# Patient Record
Sex: Female | Born: 1945 | Race: White | Hispanic: No | Smoking: Former smoker
Health system: Southern US, Community
[De-identification: ages and names within clinical notes are randomized; demographics above are authoritative.]

## PROBLEM LIST (undated history)

## (undated) DIAGNOSIS — K219 Gastro-esophageal reflux disease without esophagitis: Secondary | ICD-10-CM

## (undated) DIAGNOSIS — I447 Left bundle-branch block, unspecified: Secondary | ICD-10-CM

## (undated) DIAGNOSIS — R102 Pelvic and perineal pain: Secondary | ICD-10-CM

## (undated) DIAGNOSIS — G43909 Migraine, unspecified, not intractable, without status migrainosus: Secondary | ICD-10-CM

## (undated) DIAGNOSIS — J449 Chronic obstructive pulmonary disease, unspecified: Secondary | ICD-10-CM

## (undated) DIAGNOSIS — E785 Hyperlipidemia, unspecified: Secondary | ICD-10-CM

## (undated) DIAGNOSIS — R42 Dizziness and giddiness: Secondary | ICD-10-CM

## (undated) DIAGNOSIS — M1990 Unspecified osteoarthritis, unspecified site: Secondary | ICD-10-CM

## (undated) DIAGNOSIS — H269 Unspecified cataract: Secondary | ICD-10-CM

## (undated) DIAGNOSIS — N393 Stress incontinence (female) (male): Secondary | ICD-10-CM

## (undated) DIAGNOSIS — M2550 Pain in unspecified joint: Secondary | ICD-10-CM

## (undated) DIAGNOSIS — I2510 Atherosclerotic heart disease of native coronary artery without angina pectoris: Secondary | ICD-10-CM

## (undated) HISTORY — DX: Dizziness and giddiness: R42

## (undated) HISTORY — DX: Stress incontinence (female) (male): N39.3

## (undated) HISTORY — DX: Left bundle-branch block, unspecified: I44.7

## (undated) HISTORY — DX: Gastro-esophageal reflux disease without esophagitis: K21.9

## (undated) HISTORY — DX: Unspecified cataract: H26.9

## (undated) HISTORY — PX: EYE SURGERY: SHX253

## (undated) HISTORY — DX: Pain in unspecified joint: M25.50

## (undated) HISTORY — PX: TOE SURGERY: SHX1073

## (undated) HISTORY — PX: AUGMENTATION MAMMAPLASTY: SUR837

## (undated) HISTORY — DX: Pelvic and perineal pain: R10.2

## (undated) HISTORY — DX: Migraine, unspecified, not intractable, without status migrainosus: G43.909

---

## 1992-04-01 HISTORY — PX: CHOLECYSTECTOMY: SHX55

## 1997-04-01 HISTORY — PX: OTHER SURGICAL HISTORY: SHX169

## 2004-05-15 ENCOUNTER — Ambulatory Visit: Payer: Self-pay | Admitting: Internal Medicine

## 2005-07-15 ENCOUNTER — Ambulatory Visit: Payer: Self-pay | Admitting: Internal Medicine

## 2005-07-22 ENCOUNTER — Ambulatory Visit: Payer: Self-pay | Admitting: General Practice

## 2005-08-05 ENCOUNTER — Ambulatory Visit: Payer: Self-pay | Admitting: General Practice

## 2005-08-27 ENCOUNTER — Ambulatory Visit: Payer: Self-pay | Admitting: Gastroenterology

## 2006-07-17 ENCOUNTER — Ambulatory Visit: Payer: Self-pay | Admitting: Internal Medicine

## 2007-07-21 ENCOUNTER — Ambulatory Visit: Payer: Self-pay | Admitting: Internal Medicine

## 2008-01-05 ENCOUNTER — Ambulatory Visit: Payer: Self-pay | Admitting: Internal Medicine

## 2008-02-01 ENCOUNTER — Encounter: Admission: RE | Admit: 2008-02-01 | Discharge: 2008-02-01 | Payer: Self-pay | Admitting: Neurological Surgery

## 2008-02-09 ENCOUNTER — Encounter: Admission: RE | Admit: 2008-02-09 | Discharge: 2008-02-09 | Payer: Self-pay | Admitting: Neurological Surgery

## 2008-04-01 HISTORY — PX: SHOULDER SURGERY: SHX246

## 2008-04-01 HISTORY — PX: NECK SURGERY: SHX720

## 2008-04-01 HISTORY — PX: JOINT REPLACEMENT: SHX530

## 2008-04-15 ENCOUNTER — Ambulatory Visit (HOSPITAL_COMMUNITY): Admission: RE | Admit: 2008-04-15 | Discharge: 2008-04-16 | Payer: Self-pay | Admitting: Neurological Surgery

## 2008-05-16 ENCOUNTER — Encounter: Admission: RE | Admit: 2008-05-16 | Discharge: 2008-05-16 | Payer: Self-pay | Admitting: Neurological Surgery

## 2008-06-14 ENCOUNTER — Encounter: Admission: RE | Admit: 2008-06-14 | Discharge: 2008-06-14 | Payer: Self-pay | Admitting: Neurological Surgery

## 2008-07-25 ENCOUNTER — Ambulatory Visit: Payer: Self-pay | Admitting: Internal Medicine

## 2008-07-26 ENCOUNTER — Encounter: Admission: RE | Admit: 2008-07-26 | Discharge: 2008-07-26 | Payer: Self-pay | Admitting: Neurological Surgery

## 2008-07-28 ENCOUNTER — Ambulatory Visit: Payer: Self-pay | Admitting: Internal Medicine

## 2008-10-24 ENCOUNTER — Encounter: Admission: RE | Admit: 2008-10-24 | Discharge: 2008-10-24 | Payer: Self-pay | Admitting: Neurological Surgery

## 2009-08-15 ENCOUNTER — Ambulatory Visit: Payer: Self-pay | Admitting: Internal Medicine

## 2010-04-22 ENCOUNTER — Encounter: Payer: Self-pay | Admitting: Neurological Surgery

## 2010-07-16 LAB — DIFFERENTIAL
Lymphocytes Relative: 34 % (ref 12–46)
Lymphs Abs: 2.7 10*3/uL (ref 0.7–4.0)
Neutro Abs: 4.3 10*3/uL (ref 1.7–7.7)
Neutrophils Relative %: 54 % (ref 43–77)

## 2010-07-16 LAB — BASIC METABOLIC PANEL
BUN: 11 mg/dL (ref 6–23)
Creatinine, Ser: 0.61 mg/dL (ref 0.4–1.2)
GFR calc Af Amer: >60 mL/min (ref >60)
GFR calc non Af Amer: >60 mL/min (ref >60)

## 2010-07-16 LAB — CBC
Platelets: 298 10*3/uL (ref 150–400)
WBC: 7.9 10*3/uL (ref 4.0–10.5)

## 2010-07-16 LAB — PROTIME-INR
INR: 1 (ref 0.00–1.49)
Prothrombin Time: 12.8 seconds (ref 11.6–15.2)

## 2010-07-16 LAB — APTT: aPTT: 29 seconds (ref 24–37)

## 2010-08-14 NOTE — Op Note (Signed)
NAME:  Danielle Richardson, MARIE               ACCOUNT NO.:  [id number]   MEDICAL RECORD NO.:  [id number]          PATIENT TYPE:  OIB   LOCATION:  3536                         FACILITY:  [facility]   PHYSICIAN:  Tia Alert, MD     DATE OF BIRTH:  Dec 15, 1945   DATE OF PROCEDURE:  04/15/2008  DATE OF DISCHARGE:                               OPERATIVE REPORT   PREOPERATIVE DIAGNOSIS:  Cervical spondylosis with cervical disk  herniation C3-4, C4-5 with neck and shoulder pain.   POSTOPERATIVE DIAGNOSIS:  Cervical spondylosis with cervical disk  herniation C3-4, C4-5 with neck and shoulder pain.   PROCEDURES:  1. Decompressive anterior cervical diskectomy C3-4, C4-5.  2. Anterior cervical arthrodesis C3-4, C4-5 utilizing a 7-mm      corticocancellous allografts.  3. Anterior cervical plating C3-C5 inclusive utilizing the Biomet      anterior cervical plate.   SURGEON:  Tia Alert, MD   ASSISTANT:  Kathaleen Maser. Pool, MD   ANESTHESIA:  General endotracheal.   COMPLICATIONS:  None apparent.   INDICATIONS FOR PROCEDURE:  [name] is a 65 year old female who  presented with severe neck pain with headaches and shoulder pain.  She  had an MRI and then a CT myelogram, which showed midline disk  herniations at C3-4 and C4-5.  The C4-5 disk herniation being much  larger causing some cord compression.  I recommended a two-level  anterior cervical diskectomy and fusion with plating.  She understood  the risks, benefits, expected outcome, and wished to proceed.   DESCRIPTION OF PROCEDURE:  The patient was taken to the operating room  and after induction of adequate generalized endotracheal anesthesia, she  was placed in a supine position on the operating room table.  Her right  anterior cervical region was prepped with DuraPrep and then draped in  the usual sterile fashion.  Local anesthesia 5 mL was injected and a  transverse incision was made to the right of midline and carried down to  the  platysma, which was elevated, opened and undermined with Metzenbaum  scissors.  I then dissected in a plane medial to the sternocleidomastoid  muscle and internal carotid artery and lateral to the trachea and  esophagus to expose C3-4 and C4-5.  Intraoperative fluoroscopy confirmed  my level and then the longus colli muscles were taken down and the  Shadow-Line retractors were placed under this to expose C3-4 and C4-5.  Intraoperative fluoroscopy once again confirmed my level and then the  disk spaces were incised and an initial diskectomy was done with  pituitary rongeurs and curved curettes.  I then used the high-speed  drill to drill the endplates for later arthrodesis.  I drilled in a  rectangular fashion drilling down to the level of the posterior  osteophytes and posterior longitudinal ligament.  I started at C3-4, I  opened the posterior longitudinal ligament utilizing microscopic  dissection with a nerve hook and then undercut the vertebral bodies with  1 and 2 mm Kerrison punch until the dura was full all the way across.  I  then palpated in a circumferential  fashion with a nerve hook to assure  adequate decompression by both visualization and palpation and it  appeared to be well decompressed.  We measured the interspace to be 7 mm  and I used 7-mm corticocancellous allograft into the interspace at C3-4.  We then performed the exact same decompression at C4-5, opening the  posterior longitudinal ligament with a nerve hook and then using the 1  and 2 mm Kerrison punch to undercut the vertebral bodies at C4 and C5.  There was a large disk herniation in the midline, it was removed with a  nerve hook from the midline, superior to the interspace at C4-5 behind  the C4 vertebral body.  The vertebral bodies were undercut until the  dura was relaxed all the way across, we palpated with a nerve hook in a  circumferential fashion to assure adequate decompression of the nerve  root and of  the central canal.  We then irrigated with saline solution  and measured this interspace to be 7 mm, I used a 7-mm corticocancellous  allograft and tapped this into position at C4-5.  We then used a Biomet  anterior cervical plate, placed two 13 mm variable angle screws in the  bodies of C3, C4, and C5.  These locked the plate by locking mechanism.  I then irrigated with saline solution containing bacitracin, dried all  bleeding points with surgery foam and with bipolar cautery, irrigated  this away and once meticulous hemostasis was achieved, closed the  platysma with 3-0 Vicryl, closed the subcuticular tissue with 0 Vicryl,  and closed the skin with benzoin and Steri-Strips.  The drapes were  removed.  A sterile dressing was applied.  The patient was awakened from  general anesthesia and transferred to the recovery room in stable  condition.  At the end of the procedure, all sponge, needle, and  instrument counts were correct.      Tia Alert, MD  Electronically Signed     DSJ/MEDQ  D:  04/15/2008  T:  04/16/2008  Job:  [id number]

## 2010-08-20 ENCOUNTER — Ambulatory Visit: Payer: Self-pay | Admitting: Internal Medicine

## 2011-09-11 ENCOUNTER — Ambulatory Visit: Payer: Self-pay | Admitting: Internal Medicine

## 2012-09-28 ENCOUNTER — Ambulatory Visit: Payer: Self-pay | Admitting: Internal Medicine

## 2013-05-25 ENCOUNTER — Ambulatory Visit: Payer: Self-pay | Admitting: Gastroenterology

## 2013-05-28 LAB — PATHOLOGY REPORT

## 2013-09-11 DIAGNOSIS — K219 Gastro-esophageal reflux disease without esophagitis: Secondary | ICD-10-CM | POA: Insufficient documentation

## 2013-09-11 DIAGNOSIS — E785 Hyperlipidemia, unspecified: Secondary | ICD-10-CM | POA: Insufficient documentation

## 2013-09-11 DIAGNOSIS — R002 Palpitations: Secondary | ICD-10-CM | POA: Insufficient documentation

## 2013-09-11 DIAGNOSIS — M810 Age-related osteoporosis without current pathological fracture: Secondary | ICD-10-CM | POA: Insufficient documentation

## 2013-09-11 DIAGNOSIS — J449 Chronic obstructive pulmonary disease, unspecified: Secondary | ICD-10-CM

## 2013-09-11 DIAGNOSIS — Z8669 Personal history of other diseases of the nervous system and sense organs: Secondary | ICD-10-CM | POA: Insufficient documentation

## 2013-09-11 DIAGNOSIS — I10 Essential (primary) hypertension: Secondary | ICD-10-CM | POA: Insufficient documentation

## 2013-09-11 DIAGNOSIS — J309 Allergic rhinitis, unspecified: Secondary | ICD-10-CM | POA: Insufficient documentation

## 2013-09-11 HISTORY — DX: Hyperlipidemia, unspecified: E78.5

## 2013-09-11 HISTORY — DX: Chronic obstructive pulmonary disease, unspecified: J44.9

## 2013-09-29 ENCOUNTER — Ambulatory Visit: Payer: Self-pay | Admitting: Internal Medicine

## 2013-10-19 DIAGNOSIS — G43019 Migraine without aura, intractable, without status migrainosus: Secondary | ICD-10-CM | POA: Insufficient documentation

## 2014-03-30 DIAGNOSIS — M65811 Other synovitis and tenosynovitis, right shoulder: Secondary | ICD-10-CM | POA: Insufficient documentation

## 2014-03-30 DIAGNOSIS — Z9660 Presence of unspecified orthopedic joint implant: Secondary | ICD-10-CM | POA: Insufficient documentation

## 2014-03-30 DIAGNOSIS — J0110 Acute frontal sinusitis, unspecified: Secondary | ICD-10-CM | POA: Insufficient documentation

## 2014-10-21 ENCOUNTER — Other Ambulatory Visit: Payer: Self-pay | Admitting: Internal Medicine

## 2014-10-21 DIAGNOSIS — Z1231 Encounter for screening mammogram for malignant neoplasm of breast: Secondary | ICD-10-CM

## 2014-10-26 ENCOUNTER — Ambulatory Visit
Admission: RE | Admit: 2014-10-26 | Discharge: 2014-10-26 | Disposition: A | Discharge status: Home / Self Care | Payer: Medicare Other | Source: Ambulatory Visit | Attending: Internal Medicine | Admitting: Internal Medicine

## 2014-10-26 ENCOUNTER — Other Ambulatory Visit: Payer: Self-pay | Admitting: Internal Medicine

## 2014-10-26 DIAGNOSIS — Z1231 Encounter for screening mammogram for malignant neoplasm of breast: Secondary | ICD-10-CM | POA: Insufficient documentation

## 2015-04-25 ENCOUNTER — Ambulatory Visit (INDEPENDENT_AMBULATORY_CARE_PROVIDER_SITE_OTHER): Payer: Medicare Other | Admitting: Obstetrics and Gynecology

## 2015-04-25 ENCOUNTER — Encounter: Payer: Self-pay | Admitting: Obstetrics and Gynecology

## 2015-04-25 VITALS — BP 156/75 | HR 73 | Ht 65.0 in | Wt 178.9 lb

## 2015-04-25 DIAGNOSIS — R102 Pelvic and perineal pain: Secondary | ICD-10-CM

## 2015-04-25 DIAGNOSIS — R0789 Other chest pain: Secondary | ICD-10-CM | POA: Insufficient documentation

## 2015-04-25 DIAGNOSIS — M1990 Unspecified osteoarthritis, unspecified site: Secondary | ICD-10-CM | POA: Insufficient documentation

## 2015-04-25 DIAGNOSIS — N941 Unspecified dyspareunia: Secondary | ICD-10-CM | POA: Diagnosis not present

## 2015-04-25 DIAGNOSIS — N3941 Urge incontinence: Secondary | ICD-10-CM

## 2015-04-25 DIAGNOSIS — N393 Stress incontinence (female) (male): Secondary | ICD-10-CM

## 2015-04-25 DIAGNOSIS — N952 Postmenopausal atrophic vaginitis: Secondary | ICD-10-CM | POA: Diagnosis not present

## 2015-04-25 DIAGNOSIS — N951 Menopausal and female climacteric states: Secondary | ICD-10-CM | POA: Insufficient documentation

## 2015-04-25 DIAGNOSIS — N3946 Mixed incontinence: Secondary | ICD-10-CM | POA: Insufficient documentation

## 2015-04-25 DIAGNOSIS — K2970 Gastritis, unspecified, without bleeding: Secondary | ICD-10-CM | POA: Insufficient documentation

## 2015-04-25 MED ORDER — OXYBUTYNIN CHLORIDE 5 MG PO TABS
5.0000 mg | ORAL_TABLET | Freq: Every day | ORAL | Status: DC
Start: 2015-04-25 — End: 2015-07-19

## 2015-04-25 MED ORDER — ESTROGENS, CONJUGATED 0.625 MG/GM VA CREA: 0.2500 | TOPICAL_CREAM | VAGINAL | Status: DC

## 2015-04-25 MED ORDER — ESTRADIOL 0.1 MG/GM VA CREA: 0.2500 | TOPICAL_CREAM | Freq: Every day | VAGINAL | Status: DC

## 2015-04-25 NOTE — Progress Notes (Signed)
GYN ENCOUNTER NOTE  Subjective:       Danielle Richardson is a 70 y.o. G58P2002 female is here for gynecologic evaluation of the following issues:  1.  Pelvic pain   2.  Urge incontinence. 3.  Stress incontinece    70 year old married white female, para 2002, menopausal since age 12, using no hormone replacement therapy, history of pubovaginal sling by Dr. Elisabeth Pigeon in 1999) mesh), presents for evaluation of above complaints.  PELVIC PAIN: Patient reports the pain to be described as something quotes sticking in me".   She feels pelvic sure, which tends to get worse over the day; symptoms improved when lying down.  The colon sitting does not help symptoms; voiding makes symptoms a little bit better   Recent urinalysis is negative for evidence of infection. Patient discontinued intercourse approximately one year ago because became too painful to continue.  She does not report any vaginal bleeding or discharge.  GYN history: Menarche age 64. Menopause age 46. History of HRT therapy for less than 1 year, discontinued due to chronic abnormal uterine bleeding. No current vasomotor symptoms. History of osteoporosis. No history of abnormal Pap smears. History of pubovaginal sling performed by Dr. Elisabeth Pigeon in 1999; initially helped stress incontinence.Marland Kitchen History of dyspareunia, severe enough to [facility] relations 2016 No vaginal bleeding or vaginal discharge   Gynecologic History No LMP recorded. Patient is postmenopausal.   GI history: Bowel movements eve other day. History of chronic constipation requiring MiraLAX use. No significant splinting is noted.  Obstetric History OB History  Gravida Para Term Preterm AB SAB TAB Ectopic Multiple Living  2 2 2       2     # Outcome Date GA Lbr Len/2nd Weight Sex Delivery Anes PTL Lv  2 Term 1972   7 lb 1.8 oz (3.225 kg) F Vag-Spont   Y  1 Term 1968   6 lb 11.2 oz (3.039 kg) M Vag-Spont   Y      Past Medical History  Diagnosis  Date  . Migraine   . Vertigo   . Joint pain   . Pelvic pain in female   . GERD (gastroesophageal reflux disease)   . SUI (stress urinary incontinence, female)     Past Surgical History  Procedure Laterality Date  . Augmentation mammaplasty Bilateral     1980  . Augmentation mammaplasty Bilateral     implants removed 1993  . Bladder tack  1999  . Cholecystectomy  1994  . Toe surgery    . Neck surgery    . Shoulder surgery      No current outpatient prescriptions on file prior to visit.   No current facility-administered medications on file prior to visit.    Allergies  Allergen Reactions  . Alendronate Nausea Only  . Codeine Other (See Comments)  . Penicillins Other (See Comments)  . Rofecoxib Other (See Comments)  . Oseltamivir Rash    Social History   Social History  . Marital Status: Married    Spouse Name: N/A  . Number of Children: N/A  . Years of Education: N/A   Occupational History  . Not on file.   Social History Main Topics  . Smoking status: Former Smoker    Quit date: 04/02/1999  . Smokeless tobacco: Not on file  . Alcohol Use: Yes     Comment: rare  . Drug Use: No  . Sexual Activity: No   Other Topics Concern  . Not on file  Social History Narrative    Family History  Problem Relation Age of Onset  . Diabetes Father   . Diabetes Brother   . Colon cancer Maternal Uncle   . Ovarian cancer Maternal Grandfather   . Breast cancer Neg Hx     The following portions of the patient's history were reviewed and updated as appropriate: allergies, current medications, past family history, past medical history, past social history, past surgical history and problem list.  Review of Systems Review of Systems - General ROS: negative for - chills, fatigue, fever, hot flashes, malaise or night sweats Hematological and Lymphatic ROS: negative for - bleeding problems or swollen lymph nodes Gastrointestinal ROS: negative for - abdominal pain, blood  in stools, change in bowel habits and nausea/vomiting Musculoskeletal ROS: negative for - joint pain, muscle pain or muscular weakness Genito-Urinary ROS: negative for - change in menstrual cycle, dysmenorrhea, dysuria, genital discharge, genital ulcers, hematuria, incontinence, irregular/heavy menses, nocturia. POSITIVE- dyspareunia, Pelvic pain  Objective:   BP 156/75 mmHg  Pulse 73  Ht 5\' 5"  (1.651 m)  Wt 178 lb 14.4 oz (81.149 kg)  BMI 29.77 kg/m2 CONSTITUTIONAL: Well-developed, well-nourished female in no acute distress.  HENT:  Normocephalic, atraumatic.  NECK: Normal range of motion, supple, no masses.  Normal thyroid.  SKIN: Skin is warm and dry. No rash noted. Not diaphoretic. No erythema. No pallor. [place]: Alert and oriented to person, place, and time. PSYCHIATRIC: Normal mood and affect. Normal behavior. Normal judgment and thought content. CARDIOVASCULAR:Not Examined RESPIRATORY: Not Examined BREASTS: Not Examined ABDOMEN: Soft, non distended; Non tender.  No Organomegaly. PELVIC:  External Genitalia: Atrophic  BUS: Normal  Vagina: Moderate atrophy; no significant prolapse; just proximal to the introitus in the suburethral area is  Scarring from previous sling which is tender 2/4 without evidence of erosion  Cervix: Normal; No cervical motion tenderness  Uterus: Normal size, shape,consistency, mobileComment nontender  Adnexa: Normal   RV: Normal External exam; normal Sphincter tone,; no rectal masses  Bladder: Nontender MUSCULOSKELETAL: Normal range of motion. No tenderness.  No cyanosis, clubbing, or edema.     Assessment:   1. Pelvic pain in female - US Pelvis Complete; Future - US Transvaginal Non-OB; Future  2. Vaginal atrophy  3. Stress incontinence, female  4. Urge incontinence     Plan:   1.  Pelvic ultrasound. 2.  Begin oxybutynin 5 mg nightly 3.  Begin Estrace cream 1/2 g Vaginally nightly 30 days; then  Twice weekly application 4.   Discussed physical therapy for mixed incontinence management. 5.  Return in 1 month for follow-up  Brayton Mars, MD  Note: This dictation was prepared with Dragon dictation along with smaller phrase technology. Any transcriptional errors that result from this process are unintentional.

## 2015-04-25 NOTE — Patient Instructions (Signed)
1.  Pelvic ultrasound is ordered. 2.  Begin Estrace cream 1/2 g intravaginally nightly for 4 weeks, then twice weekly. 3.  Again, oxybutynin 5 mg daily at bedtime for urge symptoms. 4.  Return in 4 weeks

## 2015-04-25 NOTE — Addendum Note (Signed)
Addended by: Elouise Munroe on: 04/25/2015 04:29 PM   Modules accepted: Orders

## 2015-04-26 ENCOUNTER — Telehealth: Payer: Self-pay | Admitting: Obstetrics and Gynecology

## 2015-04-26 MED ORDER — ESTROGENS, CONJUGATED 0.625 MG/GM VA CREA: 0.2500 | TOPICAL_CREAM | VAGINAL | Status: DC

## 2015-04-26 NOTE — Telephone Encounter (Signed)
Went to pick up estrace, it was $265.00. She can't afford that. Do we have samples and does he pescribe premarin instead. Use [facility] instead of [facility] for this one.

## 2015-04-26 NOTE — Telephone Encounter (Signed)
Pt aware premarin sent [facility].

## 2015-05-02 ENCOUNTER — Ambulatory Visit (INDEPENDENT_AMBULATORY_CARE_PROVIDER_SITE_OTHER): Payer: Medicare Other

## 2015-05-02 DIAGNOSIS — R102 Pelvic and perineal pain: Secondary | ICD-10-CM

## 2015-05-10 ENCOUNTER — Telehealth: Payer: Self-pay | Admitting: Obstetrics and Gynecology

## 2015-05-10 MED ORDER — ESTROGENS, CONJUGATED 0.625 MG/GM VA CREA: 0.2500 | TOPICAL_CREAM | Freq: Every day | VAGINAL | Status: DC

## 2015-05-10 NOTE — Telephone Encounter (Signed)
Pt aware rx erx to [facility].

## 2015-05-10 NOTE — Telephone Encounter (Signed)
Danielle Richardson is having problems with pharmacy getting premarin cream. She wants you to send a 30 day supply to [facility] until she can get this mess straightened up with [facility]

## 2015-05-23 ENCOUNTER — Encounter: Payer: Self-pay | Admitting: Obstetrics and Gynecology

## 2015-05-23 ENCOUNTER — Ambulatory Visit (INDEPENDENT_AMBULATORY_CARE_PROVIDER_SITE_OTHER): Payer: Medicare Other | Admitting: Obstetrics and Gynecology

## 2015-05-23 VITALS — BP 166/73 | HR 79 | Ht 65.5 in | Wt 180.6 lb

## 2015-05-23 DIAGNOSIS — N882 Stricture and stenosis of cervix uteri: Secondary | ICD-10-CM | POA: Diagnosis not present

## 2015-05-23 DIAGNOSIS — R938 Abnormal findings on diagnostic imaging of other specified body structures: Secondary | ICD-10-CM

## 2015-05-23 DIAGNOSIS — R9389 Abnormal findings on diagnostic imaging of other specified body structures: Secondary | ICD-10-CM | POA: Insufficient documentation

## 2015-05-23 DIAGNOSIS — N3946 Mixed incontinence: Secondary | ICD-10-CM | POA: Diagnosis not present

## 2015-05-23 DIAGNOSIS — D259 Leiomyoma of uterus, unspecified: Secondary | ICD-10-CM | POA: Insufficient documentation

## 2015-05-23 NOTE — Patient Instructions (Signed)
1. Endometrial biopsy is done today. 2. Tylenol or Advil as needed for pain is recommended 3. Return in 10 days for follow-up on an initial biopsy results 4. Continue taking oxybutynin 5 mg twice a day    ENDOMETRIAL BIOPSY POST-PROCEDURE INSTRUCTIONS  1. You may take Ibuprofen, Aleve or Tylenol for pain if needed.  Cramping should resolve within in 24 hours.  2. You may have a small amount of spotting.  You should wear a mini pad for the next few days.  3. You may have intercourse after 24 hours.  4. You need to call if you have any pelvic pain, fever, heavy bleeding or foul smelling vaginal discharge.  5. Shower or bathe as normal  6. We will call you within one week with results or we will discuss   the results at your follow-up appointment if needed.

## 2015-05-23 NOTE — Progress Notes (Signed)
Chief complaint: 1. Stable bladder 2. Pelvic pain 3. Thickened endometrium on ultrasound 4. Uterine fibroid  Patient presents for follow-up on pelvic ultrasound as well as medication which was started for unstable bladder.  UNSTABLE BLADDER: Patient is on oxybutynin 5 mg twice a day. She has had resolution of her urge incontinence. However, she still is getting up twice a night to void. She is pleased with the medication at this time and wishes to continue the current dosing regimen. She is not having any significant side effects such as dry mouth or dry eyes.  PELVIC PAIN: Recent ultrasound demonstrated the patient having a small uterine fibroid as well as a thickened endometrium measuring 11 mm. She is not experiencing any vaginal bleeding or spotting. The patient continues to have shooting stabbing pains within the pelvis. Endometrial biopsy is recommended because of the thickened endometrium in this patient not on HRT.  OBJECTIVE: BP 166/73 mmHg  Pulse 79  Ht 5' 5.5" (1.664 m)  Wt 180 lb 9.6 oz (81.92 kg)  BMI 29.59 kg/m2 Pleasant white female in no acute distress Abdomen: Soft, nontender, without organomegaly Pelvic exam: External genitalia-normal BUS-normal Vagina-normal Cervix-parous; stenotic with inability to pass a 3 mm Pipelle. No cervical motion tenderness Uterus-midplane, normal size and shape, mobile, nontender Adnexa-nonpalpable nontender RV-normal external exam  PROCEDURE: Endometrial biopsy The patient verbally consents to the procedure. Benefits and risks were discussed. Bimanual exam demonstrates the uterus to be midplane and normal size and shape. Speculum is placed in the vagina. 3 mm Pipelle instrument cannot be passed through the cervical os due to stenosis. Single-tooth tenaculum was applied to the anterior lip of the cervix. The cervix was dilated with the uterine sound; the sound measures to a depth of 8 cm. The Pipelle, 3 mm instrument was then passed  successfully to the uterine fundus with extraction of granular yellow pink tissue, moderate. Minimal bleeding is encountered. Instrument patient is removed from the vagina. Procedures well-tolerated. Specimens sent to pathology.  ASSESSMENT: 1. Pelvic pain, unclear etiology 2. Small uterine fibroid; thickened endometrium on ultrasound 3. Cervical stenosis 4. Unstable bladder, symptom improvement on oxybutynin 5 mg twice a day  PLAN: 1. Cervical canal dilation 2. Endometrial biopsy 3. Continue with oxybutynin 5 mg twice a day 4. Return in 10 days for follow-up endometrial biopsy results and further management planning  Brayton Mars, MD  Note: This dictation was prepared with Dragon dictation along with smaller phrase technology. Any transcriptional errors that result from this process are unintentional.

## 2015-05-25 LAB — PATHOLOGY

## 2015-06-01 ENCOUNTER — Encounter: Payer: Self-pay | Admitting: Obstetrics and Gynecology

## 2015-06-01 ENCOUNTER — Ambulatory Visit (INDEPENDENT_AMBULATORY_CARE_PROVIDER_SITE_OTHER): Payer: Medicare Other | Admitting: Obstetrics and Gynecology

## 2015-06-01 VITALS — BP 155/73 | HR 74 | Wt 179.2 lb

## 2015-06-01 DIAGNOSIS — R102 Pelvic and perineal pain: Secondary | ICD-10-CM | POA: Diagnosis not present

## 2015-06-01 DIAGNOSIS — N840 Polyp of corpus uteri: Secondary | ICD-10-CM

## 2015-06-01 DIAGNOSIS — R938 Abnormal findings on diagnostic imaging of other specified body structures: Secondary | ICD-10-CM

## 2015-06-01 DIAGNOSIS — R9389 Abnormal findings on diagnostic imaging of other specified body structures: Secondary | ICD-10-CM

## 2015-06-01 NOTE — Patient Instructions (Signed)
1. Hysteroscopy/D&C will be scheduled in approximately 4-6 weeks 2. Return for preop appointment the week before surgery

## 2015-06-01 NOTE — Progress Notes (Signed)
Chief complaint: 1. Thickened endometrium on ultrasound 2. Follow up on endometrial biopsy 3. Pelvic pain  Patient presents for follow-up on endometrial biopsy done for thickened endometrium identified on ultrasound.  Endometrial biopsy pathology: Benign endometrial polyp without evidence of carcinoma or hyperplasia  Patient is still experiencing atypical pelvic pain with shooting spasm-type symptoms.  Colonoscopy in 2015 was normal; bowel function is normal Patient does have urinary frequency but her symptoms with urination do not mimic the pelvic pain type symptoms.  Past medical history, past surgical history, problem list, medications, and allergies are reviewed  OBJECTIVE: Physical exam deferred  ASSESSMENT: 1. Thickened endometrium on ultrasound 2. Benign endometrial polyp on endometrial biopsy 3. Pelvic pain-stabbing shooting symptoms persist; unclear etiology  PLAN: 1. Observation versus surgical intervention with hysteroscopy/D&C was reviewed. 2. Patient desires to proceed with Hysteroscopy/D&C 3. To be scheduled within the next 4-6 weeks. Patient will return for preop appointment  A total of 15 minutes were spent face-to-face with the patient during this encounter and over half of that time dealt with counseling and coordination of care.  Brayton Mars, MD  Note: This dictation was prepared with Dragon dictation along with smaller phrase technology. Any transcriptional errors that result from this process are unintentional.

## 2015-07-19 ENCOUNTER — Other Ambulatory Visit: Payer: Self-pay

## 2015-07-19 MED ORDER — OXYBUTYNIN CHLORIDE 5 MG PO TABS: 5.0000 mg | ORAL_TABLET | Freq: Every day | ORAL | Status: DC

## 2015-08-17 ENCOUNTER — Encounter: Payer: Self-pay | Admitting: Obstetrics and Gynecology

## 2015-08-17 ENCOUNTER — Encounter
Admission: RE | Admit: 2015-08-17 | Discharge: 2015-08-17 | Disposition: A | Discharge status: Home / Self Care | Payer: Medicare Other | Source: Ambulatory Visit | Attending: Obstetrics and Gynecology | Admitting: Obstetrics and Gynecology

## 2015-08-17 ENCOUNTER — Ambulatory Visit (INDEPENDENT_AMBULATORY_CARE_PROVIDER_SITE_OTHER): Payer: Medicare Other | Admitting: Obstetrics and Gynecology

## 2015-08-17 VITALS — BP 148/82 | HR 82 | Ht 65.5 in | Wt 179.1 lb

## 2015-08-17 DIAGNOSIS — Z01810 Encounter for preprocedural cardiovascular examination: Secondary | ICD-10-CM | POA: Insufficient documentation

## 2015-08-17 DIAGNOSIS — R938 Abnormal findings on diagnostic imaging of other specified body structures: Secondary | ICD-10-CM

## 2015-08-17 DIAGNOSIS — Z01818 Encounter for other preprocedural examination: Secondary | ICD-10-CM

## 2015-08-17 DIAGNOSIS — R102 Pelvic and perineal pain: Secondary | ICD-10-CM

## 2015-08-17 DIAGNOSIS — N840 Polyp of corpus uteri: Secondary | ICD-10-CM

## 2015-08-17 DIAGNOSIS — Z01812 Encounter for preprocedural laboratory examination: Secondary | ICD-10-CM | POA: Insufficient documentation

## 2015-08-17 DIAGNOSIS — R9389 Abnormal findings on diagnostic imaging of other specified body structures: Secondary | ICD-10-CM

## 2015-08-17 HISTORY — DX: Unspecified osteoarthritis, unspecified site: M19.90

## 2015-08-17 LAB — CBC WITH DIFFERENTIAL/PLATELET
BASOS PCT: 1 %
Basophils Absolute: 0.1 10*3/uL (ref 0–0.1)
EOS ABS: 0.1 10*3/uL (ref 0–0.7)
Eosinophils Relative: 2 %
HEMATOCRIT: 41.6 % (ref 35.0–47.0)
HEMOGLOBIN: 14.1 g/dL (ref 12.0–16.0)
LYMPHS ABS: 2.9 10*3/uL (ref 1.0–3.6)
Lymphocytes Relative: 34 %
MCH: 31.1 pg (ref 26.0–34.0)
MCHC: 33.9 g/dL (ref 32.0–36.0)
MCV: 91.7 fL (ref 80.0–100.0)
Monocytes Absolute: 0.8 10*3/uL (ref 0.2–0.9)
Monocytes Relative: 10 %
NEUTROS ABS: 4.7 10*3/uL (ref 1.4–6.5)
NEUTROS PCT: 55 %
Platelets: 228 10*3/uL (ref 150–440)
RBC: 4.54 MIL/uL (ref 3.80–5.20)
RDW: 13.2 % (ref 11.5–14.5)
WBC: 8.5 10*3/uL (ref 3.6–11.0)

## 2015-08-17 LAB — RAPID HIV SCREEN (HIV 1/2 AB+AG)
HIV 1/2 ANTIBODIES: NONREACTIVE
HIV-1 P24 ANTIGEN - HIV24: NONREACTIVE

## 2015-08-17 NOTE — Pre-Procedure Instructions (Signed)
REQUEST FOR MEDICAL CLEARANCE /EKG AS INSTRUCTED BY DR P CARROLL, CALLED AND FAXED TO DR Doy Hutching. SPOKE [place] FAXED TO DR MAD. HAD TO LEAVE MESSAGE

## 2015-08-17 NOTE — Patient Instructions (Signed)
  Your procedure is scheduled on:Aug 21, 2015 (Monday) Report to Same Day Surgery 2nd floor [facility] To find out your arrival time please call [phone] between 1PM - 3PM on Aug 18, 2015 (Friday)  Remember: Instructions that are not followed completely may result in serious medical risk, up to and including death, or upon the discretion of your surgeon and anesthesiologist your surgery may need to be rescheduled.    __x__ 1. Do not eat food or drink liquids after midnight. No gum chewing or hard candies.     __x__ 2. No Alcohol for 24 hours before or after surgery.   ____ 3. Bring all medications with you on the day of surgery if instructed.    __x__ 4. Notify your doctor if there is any change in your medical condition     (cold, fever, infections).     Do not wear jewelry, make-up, hairpins, clips or nail polish.  Do not wear lotions, powders, or perfumes. You may wear deodorant.  Do not shave 48 hours prior to surgery. Men may shave face and neck.  Do not bring valuables to the hospital.    [facility] is not responsible for any belongings or valuables.               Contacts, dentures or bridgework may not be worn into surgery.  Leave your suitcase in the car. After surgery it may be brought to your room.  For patients admitted to the hospital, discharge time is determined by your treatment team.   Patients discharged the day of surgery will not be allowed to drive home.    Please read over the following fact sheets that you were given:   [facility] Preparing for Surgery and or MRSA Information   _x___ Take these medicines the morning of surgery with A SIP OF WATER:    1. Pantoprazole (Pantoprazole at bedtime on May 21)  2.  3.  4.  5.  6.  ____ Fleet Enema (as directed)   ___ Use CHG Soap or sage wipes as directed on instruction sheet   ____ Use inhalers on the day of surgery and bring to hospital day of surgery  ____ Stop metformin 2 days prior to  surgery    ____ Take 1/2 of usual insulin dose the night before surgery and none on the morning of  Surgery        __x__ Stop aspirin or coumadin, or plavix (STOP ASPIRIN NOW)  _x__ Stop Anti-inflammatories such as Advil, Aleve, Ibuprofen, Motrin, Naproxen,          Naprosyn, Goodies powders or aspirin products.(STOP  MELOXICAM NOW)  Ok to take Tylenol.   __x_ Stop supplements until after surgery. (STOP BIOTIN NOW)   ____ Bring C-Pap to the hospital.

## 2015-08-17 NOTE — Patient Instructions (Signed)
1. Return in 1 week after surgery for a postop check on 08/29/2015

## 2015-08-17 NOTE — Progress Notes (Signed)
Subjective: PREOPERATIVE HISTORY AND PHYSICAL     Patient is a 70 y.o. G2P2044female scheduled Hysteroscopy/D&C for evaluation of thickened endometrium on ultrasound, endometrial polyps, and pelvic pain. Preoperative endometrial biopsy didn't demonstrate fragments of endometrial polyps. The patient is still experiencing atypical pelvic pain with shooting spasm Symptoms. Colonoscopy in 2015 was normal; bowel function is normal. Patient does have urinary frequency but symptoms with urination do not mimic the pelvic pain symptoms. Trial of oxybutynin had been discontinued at 5 mg daily dosage because of atypical chest pain symptoms.      Menstrual History: OB History    Gravida Para Term Preterm AB TAB SAB Ectopic Multiple Living   2 2 2       2       Menarche age: NA No LMP recorded. Patient is postmenopausal.    Past Medical History  Diagnosis Date  . Migraine   . Vertigo   . Joint pain   . Pelvic pain in female   . GERD (gastroesophageal reflux disease)   . SUI (stress urinary incontinence, female)     Past Surgical History  Procedure Laterality Date  . Augmentation mammaplasty Bilateral     1980  . Augmentation mammaplasty Bilateral     implants removed 1993  . Bladder tack  1999  . Cholecystectomy  1994  . Toe surgery    . Neck surgery    . Shoulder surgery      OB History  Gravida Para Term Preterm AB SAB TAB Ectopic Multiple Living  2 2 2       2     # Outcome Date GA Lbr Len/2nd Weight Sex Delivery Anes PTL Lv  2 Term 1972   7 lb 1.8 oz (3.225 kg) F Vag-Spont   Y  1 Term 1968   6 lb 11.2 oz (3.039 kg) M Vag-Spont   Y      Social History   Social History  . Marital Status: Married    Spouse Name: N/A  . Number of Children: N/A  . Years of Education: N/A   Social History Main Topics  . Smoking status: Former Smoker    Quit date: 04/02/1999  . Smokeless tobacco: None  . Alcohol Use: Yes     Comment: rare  . Drug Use: No  . Sexual Activity: No    Other Topics Concern  . None   Social History Narrative    Family History  Problem Relation Age of Onset  . Diabetes Father   . Diabetes Brother   . Colon cancer Maternal Uncle   . Ovarian cancer Maternal Grandfather   . Breast cancer Neg Hx      (Not in a hospital admission)  Allergies  Allergen Reactions  . Alendronate Nausea Only  . Codeine Other (See Comments)  . Penicillins Other (See Comments)  . Rofecoxib Other (See Comments)  . Oseltamivir Rash    Review of Systems Constitutional: No recent fever/chills/sweats Respiratory: No recent cough/bronchitis Cardiovascular: No chest pain Gastrointestinal: No recent nausea/vomiting/diarrhea Genitourinary: No UTI symptoms Hematologic/lymphatic:No history of coagulopathy or recent blood thinner use    Objective:    BP 148/82 mmHg  Pulse 82  Ht 5' 5.5" (1.664 m)  Wt 179 lb 1.6 oz (81.239 kg)  BMI 29.34 kg/m2  General:   Normal  Skin:   normal  HEENT:  Normal  Neck:  Supple without Adenopathy or Thyromegaly  Lungs:   Heart:  Breasts:   Abdomen:  Pelvis:  M/S   Extremeties:  Neuro:    clear to auscultation bilaterally   Normal without murmur   Not Examined   soft, non-tender; bowel sounds normal; no masses,  no organomegaly   Exam deferred to OR  No CVAT  Warm/Dry   Normal          Assessment:    1. Thickened endometrium on ultrasound 2. Endometrial biopsy consistent with endometrial polyp fragments 3. Atypical pelvic pain Plan:   1. Hysteroscopy/D&C  Prep counseling: Patient is to undergo hysteroscopy/D&C for thickened endometrium on ultrasound and findings of endometrial polyps on biopsy. She is understanding of the planned procedure and is aware of and is uncertain of all surgical risks to include but are not limited to bleeding, infection, pelvic organ injury with need for repair, blood clot disorders, anesthesia risks, etc. All questions have been answered. Informed consent is  given. Patient is ready and willing to proceed with surgery as scheduled.  Brayton Mars, MD  Note: This dictation was prepared with Dragon dictation along with smaller phrase technology. Any transcriptional errors that result from this process are unintentional.

## 2015-08-18 LAB — RPR: RPR: NONREACTIVE

## 2015-08-21 ENCOUNTER — Encounter: Admission: RE | Payer: Self-pay | Source: Ambulatory Visit

## 2015-08-21 ENCOUNTER — Ambulatory Visit
Admission: RE | Admit: 2015-08-21 | Payer: Medicare Other | Source: Ambulatory Visit | Admitting: Obstetrics and Gynecology

## 2015-08-21 SURGERY — DILATATION AND CURETTAGE /HYSTEROSCOPY
Anesthesia: General

## 2015-08-29 ENCOUNTER — Encounter: Payer: Medicare Other | Admitting: Obstetrics and Gynecology

## 2015-08-31 ENCOUNTER — Encounter: Payer: Medicare Other | Admitting: Obstetrics and Gynecology

## 2015-08-31 DIAGNOSIS — I200 Unstable angina: Secondary | ICD-10-CM | POA: Diagnosis present

## 2015-08-31 DIAGNOSIS — I447 Left bundle-branch block, unspecified: Secondary | ICD-10-CM | POA: Insufficient documentation

## 2015-09-12 ENCOUNTER — Encounter
Admission: RE | Disposition: A | Discharge status: Home / Self Care | Payer: Self-pay | Source: Ambulatory Visit | Attending: Internal Medicine

## 2015-09-12 ENCOUNTER — Encounter: Payer: Self-pay | Admitting: *Deleted

## 2015-09-12 ENCOUNTER — Ambulatory Visit
Admission: RE | Admit: 2015-09-12 | Discharge: 2015-09-12 | Disposition: A | Discharge status: Home / Self Care | Payer: Medicare Other | Source: Ambulatory Visit | Attending: Internal Medicine | Admitting: Internal Medicine

## 2015-09-12 DIAGNOSIS — I10 Essential (primary) hypertension: Secondary | ICD-10-CM | POA: Diagnosis not present

## 2015-09-12 DIAGNOSIS — E7800 Pure hypercholesterolemia, unspecified: Secondary | ICD-10-CM | POA: Diagnosis not present

## 2015-09-12 DIAGNOSIS — M810 Age-related osteoporosis without current pathological fracture: Secondary | ICD-10-CM | POA: Insufficient documentation

## 2015-09-12 DIAGNOSIS — Z79899 Other long term (current) drug therapy: Secondary | ICD-10-CM | POA: Insufficient documentation

## 2015-09-12 DIAGNOSIS — Z9882 Breast implant status: Secondary | ICD-10-CM | POA: Diagnosis not present

## 2015-09-12 DIAGNOSIS — M19011 Primary osteoarthritis, right shoulder: Secondary | ICD-10-CM | POA: Diagnosis not present

## 2015-09-12 DIAGNOSIS — Z885 Allergy status to narcotic agent status: Secondary | ICD-10-CM | POA: Insufficient documentation

## 2015-09-12 DIAGNOSIS — I447 Left bundle-branch block, unspecified: Secondary | ICD-10-CM | POA: Diagnosis not present

## 2015-09-12 DIAGNOSIS — Z818 Family history of other mental and behavioral disorders: Secondary | ICD-10-CM | POA: Diagnosis not present

## 2015-09-12 DIAGNOSIS — I259 Chronic ischemic heart disease, unspecified: Secondary | ICD-10-CM | POA: Diagnosis not present

## 2015-09-12 DIAGNOSIS — M19042 Primary osteoarthritis, left hand: Secondary | ICD-10-CM | POA: Diagnosis not present

## 2015-09-12 DIAGNOSIS — J449 Chronic obstructive pulmonary disease, unspecified: Secondary | ICD-10-CM | POA: Insufficient documentation

## 2015-09-12 DIAGNOSIS — Z87891 Personal history of nicotine dependence: Secondary | ICD-10-CM | POA: Insufficient documentation

## 2015-09-12 DIAGNOSIS — Z886 Allergy status to analgesic agent status: Secondary | ICD-10-CM | POA: Diagnosis not present

## 2015-09-12 DIAGNOSIS — Z7982 Long term (current) use of aspirin: Secondary | ICD-10-CM | POA: Insufficient documentation

## 2015-09-12 DIAGNOSIS — Z8249 Family history of ischemic heart disease and other diseases of the circulatory system: Secondary | ICD-10-CM | POA: Insufficient documentation

## 2015-09-12 DIAGNOSIS — Z791 Long term (current) use of non-steroidal anti-inflammatories (NSAID): Secondary | ICD-10-CM | POA: Diagnosis not present

## 2015-09-12 DIAGNOSIS — Z9889 Other specified postprocedural states: Secondary | ICD-10-CM | POA: Diagnosis not present

## 2015-09-12 DIAGNOSIS — M19041 Primary osteoarthritis, right hand: Secondary | ICD-10-CM | POA: Insufficient documentation

## 2015-09-12 DIAGNOSIS — R9430 Abnormal result of cardiovascular function study, unspecified: Secondary | ICD-10-CM | POA: Diagnosis present

## 2015-09-12 DIAGNOSIS — I200 Unstable angina: Secondary | ICD-10-CM | POA: Diagnosis present

## 2015-09-12 DIAGNOSIS — Z880 Allergy status to penicillin: Secondary | ICD-10-CM | POA: Diagnosis not present

## 2015-09-12 DIAGNOSIS — Z888 Allergy status to other drugs, medicaments and biological substances status: Secondary | ICD-10-CM | POA: Insufficient documentation

## 2015-09-12 DIAGNOSIS — Z981 Arthrodesis status: Secondary | ICD-10-CM | POA: Diagnosis not present

## 2015-09-12 DIAGNOSIS — Z833 Family history of diabetes mellitus: Secondary | ICD-10-CM | POA: Insufficient documentation

## 2015-09-12 DIAGNOSIS — R0600 Dyspnea, unspecified: Secondary | ICD-10-CM | POA: Insufficient documentation

## 2015-09-12 DIAGNOSIS — I2510 Atherosclerotic heart disease of native coronary artery without angina pectoris: Secondary | ICD-10-CM | POA: Insufficient documentation

## 2015-09-12 DIAGNOSIS — Z7951 Long term (current) use of inhaled steroids: Secondary | ICD-10-CM | POA: Insufficient documentation

## 2015-09-12 DIAGNOSIS — K219 Gastro-esophageal reflux disease without esophagitis: Secondary | ICD-10-CM | POA: Diagnosis not present

## 2015-09-12 DIAGNOSIS — E782 Mixed hyperlipidemia: Secondary | ICD-10-CM | POA: Insufficient documentation

## 2015-09-12 HISTORY — PX: CARDIAC CATHETERIZATION: SHX172

## 2015-09-12 SURGERY — LEFT HEART CATH AND CORONARY ANGIOGRAPHY
Anesthesia: Moderate Sedation

## 2015-09-12 MED ORDER — FENTANYL CITRATE (PF) 100 MCG/2ML IJ SOLN
INTRAMUSCULAR | Status: DC | PRN
  Administered 2015-09-12 (×2): 25 ug via INTRAVENOUS

## 2015-09-12 MED ORDER — ONDANSETRON HCL 4 MG/2ML IJ SOLN: 4.0000 mg | Freq: Four times a day (QID) | INTRAMUSCULAR | Status: DC | PRN

## 2015-09-12 MED ORDER — HEPARIN (PORCINE) IN NACL 2-0.9 UNIT/ML-% IJ SOLN
INTRAMUSCULAR | Status: AC
  Filled 2015-09-12: qty 500

## 2015-09-12 MED ORDER — MIDAZOLAM HCL 2 MG/2ML IJ SOLN
INTRAMUSCULAR | Status: DC | PRN
  Administered 2015-09-12 (×2): 1 mg via INTRAVENOUS

## 2015-09-12 MED ORDER — SODIUM CHLORIDE 0.9 % IV SOLN: 250.0000 mL | INTRAVENOUS | Status: DC | PRN

## 2015-09-12 MED ORDER — FENTANYL CITRATE (PF) 100 MCG/2ML IJ SOLN
INTRAMUSCULAR | Status: AC
  Filled 2015-09-12: qty 2

## 2015-09-12 MED ORDER — IOPAMIDOL (ISOVUE-300) INJECTION 61%
INTRAVENOUS | Status: DC | PRN
  Administered 2015-09-12: 110 mL via INTRA_ARTERIAL

## 2015-09-12 MED ORDER — SODIUM CHLORIDE 0.9% FLUSH: 3.0000 mL | INTRAVENOUS | Status: DC | PRN

## 2015-09-12 MED ORDER — MIDAZOLAM HCL 2 MG/2ML IJ SOLN
INTRAMUSCULAR | Status: AC
  Filled 2015-09-12: qty 2

## 2015-09-12 MED ORDER — SODIUM CHLORIDE 0.9 % WEIGHT BASED INFUSION: 3.0000 mL/kg/h | INTRAVENOUS | Status: DC

## 2015-09-12 MED ORDER — SODIUM CHLORIDE 0.9% FLUSH: 3.0000 mL | Freq: Two times a day (BID) | INTRAVENOUS | Status: DC

## 2015-09-12 MED ORDER — SODIUM CHLORIDE 0.9 % IV SOLN
INTRAVENOUS | Status: DC
  Administered 2015-09-12: 07:00:00 via INTRAVENOUS

## 2015-09-12 MED ORDER — ACETAMINOPHEN 325 MG PO TABS: 650.0000 mg | ORAL_TABLET | ORAL | Status: DC | PRN

## 2015-09-12 SURGICAL SUPPLY — 9 items
CATH INFINITI 5FR ANG PIGTAIL (CATHETERS) ×3 IMPLANT
CATH INFINITI 5FR JL4 (CATHETERS) ×3 IMPLANT
CATH INFINITI JR4 5F (CATHETERS) ×3 IMPLANT
DEVICE CLOSURE MYNXGRIP 5F (Vascular Products) ×3 IMPLANT
KIT MANI 3VAL PERCEP (MISCELLANEOUS) ×3 IMPLANT
NEEDLE PERC 18GX7CM (NEEDLE) ×3 IMPLANT
PACK CARDIAC CATH (CUSTOM PROCEDURE TRAY) ×3 IMPLANT
SHEATH AVANTI 5FR X 11CM (SHEATH) ×3 IMPLANT
WIRE EMERALD 3MM-J .035X150CM (WIRE) ×3 IMPLANT

## 2015-09-12 NOTE — Discharge Instructions (Signed)
° °  For pain at the site of your procedure, take non-aspirin medicines such as Tylenol.  Medications: A. Hold Metformin for 48 hours if applicable.  B. Continue taking all your present medications at home unless your doctor prescribes any changes.

## 2015-09-12 NOTE — Progress Notes (Signed)
Patient is alert and oriented. Reporting no pain. Right groin WNL, no hematoma, no bleeding. Pulses WNL. Vital signs stable. Up to bathroom to void, tolerating diet. Discharge instructions and follow up appt reviewed with patient and family. Discharged to home with family per orders.

## 2015-09-27 ENCOUNTER — Other Ambulatory Visit: Payer: Self-pay | Admitting: Internal Medicine

## 2015-09-27 DIAGNOSIS — Z1231 Encounter for screening mammogram for malignant neoplasm of breast: Secondary | ICD-10-CM

## 2015-10-27 ENCOUNTER — Ambulatory Visit
Admission: RE | Admit: 2015-10-27 | Discharge: 2015-10-27 | Disposition: A | Discharge status: Home / Self Care | Payer: Medicare Other | Source: Ambulatory Visit | Attending: Internal Medicine | Admitting: Internal Medicine

## 2015-10-27 ENCOUNTER — Other Ambulatory Visit: Payer: Self-pay | Admitting: Internal Medicine

## 2015-10-27 DIAGNOSIS — Z1231 Encounter for screening mammogram for malignant neoplasm of breast: Secondary | ICD-10-CM | POA: Diagnosis present

## 2015-10-27 DIAGNOSIS — R928 Other abnormal and inconclusive findings on diagnostic imaging of breast: Secondary | ICD-10-CM | POA: Insufficient documentation

## 2015-10-31 ENCOUNTER — Other Ambulatory Visit: Payer: Self-pay | Admitting: Internal Medicine

## 2015-10-31 DIAGNOSIS — N6310 Unspecified lump in the right breast, unspecified quadrant: Secondary | ICD-10-CM

## 2015-11-10 ENCOUNTER — Ambulatory Visit
Admission: RE | Admit: 2015-11-10 | Discharge: 2015-11-10 | Disposition: A | Discharge status: Home / Self Care | Payer: Medicare Other | Source: Ambulatory Visit | Attending: Internal Medicine | Admitting: Internal Medicine

## 2015-11-10 DIAGNOSIS — N63 Unspecified lump in breast: Secondary | ICD-10-CM | POA: Diagnosis not present

## 2015-11-10 DIAGNOSIS — N6310 Unspecified lump in the right breast, unspecified quadrant: Secondary | ICD-10-CM

## 2016-01-02 ENCOUNTER — Encounter: Payer: Self-pay | Admitting: Obstetrics and Gynecology

## 2016-01-02 ENCOUNTER — Encounter
Admission: RE | Admit: 2016-01-02 | Discharge: 2016-01-02 | Disposition: A | Discharge status: Home / Self Care | Payer: Medicare Other | Source: Ambulatory Visit | Attending: Obstetrics and Gynecology | Admitting: Obstetrics and Gynecology

## 2016-01-02 ENCOUNTER — Ambulatory Visit (INDEPENDENT_AMBULATORY_CARE_PROVIDER_SITE_OTHER): Payer: Medicare Other | Admitting: Obstetrics and Gynecology

## 2016-01-02 VITALS — BP 171/85 | HR 81 | Ht 65.5 in | Wt 180.3 lb

## 2016-01-02 DIAGNOSIS — R938 Abnormal findings on diagnostic imaging of other specified body structures: Secondary | ICD-10-CM

## 2016-01-02 DIAGNOSIS — Z01818 Encounter for other preprocedural examination: Secondary | ICD-10-CM

## 2016-01-02 DIAGNOSIS — R9389 Abnormal findings on diagnostic imaging of other specified body structures: Secondary | ICD-10-CM

## 2016-01-02 DIAGNOSIS — Z01812 Encounter for preprocedural laboratory examination: Secondary | ICD-10-CM | POA: Diagnosis not present

## 2016-01-02 DIAGNOSIS — N840 Polyp of corpus uteri: Secondary | ICD-10-CM

## 2016-01-02 LAB — CBC WITH DIFFERENTIAL/PLATELET
BASOS ABS: 0.1 10*3/uL (ref 0–0.1)
BASOS PCT: 1 %
Eosinophils Absolute: 0.1 10*3/uL (ref 0–0.7)
Eosinophils Relative: 1 %
HEMATOCRIT: 42.6 % (ref 35.0–47.0)
HEMOGLOBIN: 14.5 g/dL (ref 12.0–16.0)
LYMPHS PCT: 34 %
Lymphs Abs: 3.2 10*3/uL (ref 1.0–3.6)
MCH: 31.1 pg (ref 26.0–34.0)
MCHC: 33.9 g/dL (ref 32.0–36.0)
MCV: 91.8 fL (ref 80.0–100.0)
MONO ABS: 0.8 10*3/uL (ref 0.2–0.9)
Monocytes Relative: 9 %
NEUTROS ABS: 5.1 10*3/uL (ref 1.4–6.5)
NEUTROS PCT: 55 %
Platelets: 242 10*3/uL (ref 150–440)
RBC: 4.64 MIL/uL (ref 3.80–5.20)
RDW: 13.3 % (ref 11.5–14.5)
WBC: 9.3 10*3/uL (ref 3.6–11.0)

## 2016-01-02 LAB — RAPID HIV SCREEN (HIV 1/2 AB+AG)
HIV 1/2 ANTIBODIES: NONREACTIVE
HIV-1 P24 Antigen - HIV24: NONREACTIVE

## 2016-01-02 NOTE — H&P (Signed)
Subjective: PREOPERATIVE HISTORY AND PHYSICAL   Date of surgery: 01/08/2016 Procedure: Hysteroscopy/D&C   Patient is a 70 y.o. G2P2075female scheduled Hysteroscopy/D&C for evaluation of thickened endometrium on ultrasound, endometrial polyps, and pelvic pain. Preoperative endometrial biopsy did demonstrate fragments of endometrial polyps. The patient is still experiencing atypical pelvic pain with shooting spasm Symptoms. Colonoscopy in 2015 was normal; bowel function is normal. Patient does have urinary frequency but symptoms with urination do not mimic the pelvic pain symptoms. Trial of oxybutynin had been discontinued at 5 mg daily dosage because of atypical chest pain symptoms.  Surgery initially was to be performed in May 2017 but was postponed because of abnormal EKG. Patient has since gotten cardiac clearance for surgery                 Menstrual History: OB History    Gravida Para Term Preterm AB TAB SAB Ectopic Multiple Living   2 2 2       2       Menarche age: NA No LMP recorded. Patient is postmenopausal.        Past Medical History  Diagnosis Date  . Migraine   . Vertigo   . Joint pain   . Pelvic pain in female   . GERD (gastroesophageal reflux disease)   . SUI (stress urinary incontinence, female)           Past Surgical History  Procedure Laterality Date  . Augmentation mammaplasty Bilateral     1980  . Augmentation mammaplasty Bilateral     implants removed 1993  . Bladder tack  1999  . Cholecystectomy  1994  . Toe surgery    . Neck surgery    . Shoulder surgery                           OB History  Gravida Para Term Preterm AB SAB TAB Ectopic Multiple Living  2 2 2       2     # Outcome Date GA Lbr Len/2nd Weight Sex Delivery Anes PTL Lv  2 Term 1972   7 lb 1.8 oz (3.225 kg) F Vag-Spont   Y  1 Term 1968   6 lb 11.2 oz (3.039 kg) M Vag-Spont   Y      Social History        Social  History  . Marital Status: Married    Spouse Name: N/A  . Number of Children: N/A  . Years of Education: N/A         Social History Main Topics  . Smoking status: Former Smoker    Quit date: 04/02/1999  . Smokeless tobacco: None  . Alcohol Use: Yes     Comment: rare  . Drug Use: No  . Sexual Activity: No       Other Topics Concern  . None   Social History Narrative         Family History  Problem Relation Age of Onset  . Diabetes Father   . Diabetes Brother   . Colon cancer Maternal Uncle   . Ovarian cancer Maternal Grandfather   . Breast cancer Neg Hx      (Not in a hospital admission)      Allergies  Allergen Reactions  . Alendronate Nausea Only  . Codeine Other (See Comments)  . Penicillins Other (See Comments)  . Rofecoxib Other (See Comments)  . Oseltamivir Rash    Review of Systems Constitutional:  No recent fever/chills/sweats Respiratory: No recent cough/bronchitis Cardiovascular: No chest pain Gastrointestinal: No recent nausea/vomiting/diarrhea Genitourinary: No UTI symptoms Hematologic/lymphatic:No history of coagulopathy or recent blood thinner use    Objective:   BP (!) 171/85   Pulse 81   Ht 5' 5.5" (1.664 m)   Wt 180 lb 4.8 oz (81.8 kg)   BMI 29.55 kg/m    General:   Normal  Skin:   normal  HEENT:  Normal  Neck:  Supple without Adenopathy or Thyromegaly  Lungs:   Heart:              Breasts:   Abdomen:  Pelvis:  M/S   Extremeties:  Neuro:    clear to auscultation bilaterally   Normal without murmur   Not Examined   soft, non-tender; bowel sounds normal; no masses,  no organomegaly   Exam deferred to OR  No CVAT  Warm/Dry  Normal          Assessment:    1. Thickened endometrium on ultrasound 2. Endometrial biopsy consistent with endometrial polyp fragments 3. Atypical pelvic pain Plan:   1. Hysteroscopy/D&C  Prep counseling: Patient is to undergo hysteroscopy/D&C for  thickened endometrium on ultrasound and findings of endometrial polyps on biopsy. She is understanding of the planned procedure and is aware of and is uncertain of all surgical risks to include but are not limited to bleeding, infection, pelvic organ injury with need for repair, blood clot disorders, anesthesia risks, etc. All questions have been answered. Informed consent is given. Patient is ready and willing to proceed with surgery as scheduled.  Brayton Mars, MD  Note: This dictation was prepared with Dragon dictation along with smaller phrase technology. Any transcriptional errors that result from this process are unintentional.

## 2016-01-02 NOTE — Pre-Procedure Instructions (Signed)
  Office visit on 09/27/15   Plan  -Proceed to surgery and/or invasive procedure without restriction to pre or post operative and/or procedural care. The patient is at lowest risk possible for cardiovascular complications with surgical intervention and/or invasive procedure. Currently has no evidence active and/or significant angina and/or congestive heart failure. The patient may discontinue aspirin 7 days prior to procedure and restart at a safe period thereafter -Patient was counseled today about the significance of diet and exercise for further risk reduction of cardiovascular events, risk factor reduction, weight gain, and possible improvements in quality of life measures. These lifestyle modifications were discussed including a walking regimen, a diet rich in fruits and vegetables, the DASH diet, and appropriate use of medication management.-No further intervention of LBBB unchanged and stable at this time.  No orders of the defined types were placed in this encounter.  Return in about 1 year (around 09/26/2016).  Flossie Dibble, MD

## 2016-01-02 NOTE — Patient Instructions (Signed)
  Your procedure is scheduled on: Monday 01/08/16 Report to Day Surgery. To find out your arrival time please call [phone] between 1PM - 3PM on Friday 01/05/16.  Remember: Instructions that are not followed completely may result in serious medical risk, up to and including death, or upon the discretion of your surgeon and anesthesiologist your surgery may need to be rescheduled.    __x__ 1. Do not eat food or drink liquids after midnight. No gum chewing or hard candies.     __x__ 2. No Alcohol for 24 hours before or after surgery.   ____ 3. Do Not Smoke For 24 Hours Prior to Your Surgery.   ____ 4. Bring all medications with you on the day of surgery if instructed.    __x__ 5. Notify your doctor if there is any change in your medical condition     (cold, fever, infections).       Do not wear jewelry, make-up, hairpins, clips or nail polish.  Do not wear lotions, powders, or perfumes. You may wear deodorant.  Do not shave 48 hours prior to surgery. Men may shave face and neck.  Do not bring valuables to the hospital.    [facility] is not responsible for any belongings or valuables.               Contacts, dentures or bridgework may not be worn into surgery.  Leave your suitcase in the car. After surgery it may be brought to your room.  For patients admitted to the hospital, discharge time is determined by your                treatment team.   Patients discharged the day of surgery will not be allowed to drive home.   Please read over the following fact sheets that you were given:      _x___ Take these medicines the morning of surgery with A SIP OF WATER:    1. pantoprazole (PROTONIX) 40 MG tablet  2. oxycodone (OXY-IR) 5 MG capsule  3.   4.  5.  6.  ____ Fleet Enema (as directed)   ____ Use CHG Soap as directed  ____ Use inhalers on the day of surgery  ____ Stop metformin 2 days prior to surgery    ____ Take 1/2 of usual insulin dose the night before surgery and  none on the morning of surgery.   __x__ Stop aspirin on today  __x__ Stop Anti-inflammatories on  ( Meloxicam) today Tylenol or oxycodone for pain   ____ Stop supplements until after surgery.    ____ Bring C-Pap to the hospital.

## 2016-01-02 NOTE — Patient Instructions (Signed)
1. Return on 01/16/2016 for postop check

## 2016-01-02 NOTE — Progress Notes (Signed)
Subjective: PREOPERATIVE HISTORY AND PHYSICAL   Date of surgery: 01/08/2016 Procedure: Hysteroscopy/D&C   Patient is a 70 y.o. G2P2034female scheduled Hysteroscopy/D&C for evaluation of thickened endometrium on ultrasound, endometrial polyps, and pelvic pain. Preoperative endometrial biopsy did demonstrate fragments of endometrial polyps. The patient is still experiencing atypical pelvic pain with shooting spasm Symptoms. Colonoscopy in 2015 was normal; bowel function is normal. Patient does have urinary frequency but symptoms with urination do not mimic the pelvic pain symptoms. Trial of oxybutynin had been discontinued at 5 mg daily dosage because of atypical chest pain symptoms.  Surgery initially was to be performed in May 2017 but was postponed because of abnormal EKG. Patient has since gotten cardiac clearance for surgery                 Menstrual History: OB History    Gravida Para Term Preterm AB TAB SAB Ectopic Multiple Living   2 2 2       2       Menarche age: NA No LMP recorded. Patient is postmenopausal.        Past Medical History  Diagnosis Date  . Migraine   . Vertigo   . Joint pain   . Pelvic pain in female   . GERD (gastroesophageal reflux disease)   . SUI (stress urinary incontinence, female)           Past Surgical History  Procedure Laterality Date  . Augmentation mammaplasty Bilateral     1980  . Augmentation mammaplasty Bilateral     implants removed 1993  . Bladder tack  1999  . Cholecystectomy  1994  . Toe surgery    . Neck surgery    . Shoulder surgery                           OB History  Gravida Para Term Preterm AB SAB TAB Ectopic Multiple Living  2 2 2       2     # Outcome Date GA Lbr Len/2nd Weight Sex Delivery Anes PTL Lv  2 Term 1972   7 lb 1.8 oz (3.225 kg) F Vag-Spont   Y  1 Term 1968   6 lb 11.2 oz (3.039 kg) M Vag-Spont   Y      Social History        Social  History  . Marital Status: Married    Spouse Name: N/A  . Number of Children: N/A  . Years of Education: N/A         Social History Main Topics  . Smoking status: Former Smoker    Quit date: 04/02/1999  . Smokeless tobacco: None  . Alcohol Use: Yes     Comment: rare  . Drug Use: No  . Sexual Activity: No       Other Topics Concern  . None   Social History Narrative         Family History  Problem Relation Age of Onset  . Diabetes Father   . Diabetes Brother   . Colon cancer Maternal Uncle   . Ovarian cancer Maternal Grandfather   . Breast cancer Neg Hx      (Not in a hospital admission)      Allergies  Allergen Reactions  . Alendronate Nausea Only  . Codeine Other (See Comments)  . Penicillins Other (See Comments)  . Rofecoxib Other (See Comments)  . Oseltamivir Rash    Review of Systems Constitutional:  No recent fever/chills/sweats Respiratory: No recent cough/bronchitis Cardiovascular: No chest pain Gastrointestinal: No recent nausea/vomiting/diarrhea Genitourinary: No UTI symptoms Hematologic/lymphatic:No history of coagulopathy or recent blood thinner use    Objective:   BP (!) 171/85   Pulse 81   Ht 5' 5.5" (1.664 m)   Wt 180 lb 4.8 oz (81.8 kg)   BMI 29.55 kg/m    General:   Normal  Skin:   normal  HEENT:  Normal  Neck:  Supple without Adenopathy or Thyromegaly  Lungs:   Heart:              Breasts:   Abdomen:  Pelvis:  M/S   Extremeties:  Neuro:    clear to auscultation bilaterally   Normal without murmur   Not Examined   soft, non-tender; bowel sounds normal; no masses,  no organomegaly   Exam deferred to OR  No CVAT  Warm/Dry  Normal          Assessment:    1. Thickened endometrium on ultrasound 2. Endometrial biopsy consistent with endometrial polyp fragments 3. Atypical pelvic pain Plan:   1. Hysteroscopy/D&C  Prep counseling: Patient is to undergo hysteroscopy/D&C for  thickened endometrium on ultrasound and findings of endometrial polyps on biopsy. She is understanding of the planned procedure and is aware of and is uncertain of all surgical risks to include but are not limited to bleeding, infection, pelvic organ injury with need for repair, blood clot disorders, anesthesia risks, etc. All questions have been answered. Informed consent is given. Patient is ready and willing to proceed with surgery as scheduled.  Brayton Mars, MD  Note: This dictation was prepared with Dragon dictation along with smaller phrase technology. Any transcriptional errors that result from this process are unintentional.

## 2016-01-03 LAB — RPR: RPR Ser Ql: NONREACTIVE

## 2016-01-08 ENCOUNTER — Ambulatory Visit: Payer: Medicare Other | Admitting: Anesthesiology

## 2016-01-08 ENCOUNTER — Ambulatory Visit
Admission: RE | Admit: 2016-01-08 | Discharge: 2016-01-08 | Disposition: A | Discharge status: Home / Self Care | Payer: Medicare Other | Source: Ambulatory Visit | Attending: Obstetrics and Gynecology | Admitting: Obstetrics and Gynecology

## 2016-01-08 ENCOUNTER — Encounter: Payer: Self-pay | Admitting: Anesthesiology

## 2016-01-08 ENCOUNTER — Encounter
Admission: RE | Disposition: A | Discharge status: Home / Self Care | Payer: Self-pay | Source: Ambulatory Visit | Attending: Obstetrics and Gynecology

## 2016-01-08 DIAGNOSIS — Z833 Family history of diabetes mellitus: Secondary | ICD-10-CM | POA: Insufficient documentation

## 2016-01-08 DIAGNOSIS — Z888 Allergy status to other drugs, medicaments and biological substances status: Secondary | ICD-10-CM | POA: Insufficient documentation

## 2016-01-08 DIAGNOSIS — Z885 Allergy status to narcotic agent status: Secondary | ICD-10-CM | POA: Insufficient documentation

## 2016-01-08 DIAGNOSIS — N840 Polyp of corpus uteri: Secondary | ICD-10-CM | POA: Diagnosis not present

## 2016-01-08 DIAGNOSIS — N393 Stress incontinence (female) (male): Secondary | ICD-10-CM | POA: Insufficient documentation

## 2016-01-08 DIAGNOSIS — M1990 Unspecified osteoarthritis, unspecified site: Secondary | ICD-10-CM | POA: Insufficient documentation

## 2016-01-08 DIAGNOSIS — K219 Gastro-esophageal reflux disease without esophagitis: Secondary | ICD-10-CM | POA: Insufficient documentation

## 2016-01-08 DIAGNOSIS — Z87891 Personal history of nicotine dependence: Secondary | ICD-10-CM | POA: Diagnosis not present

## 2016-01-08 DIAGNOSIS — Z800 Family history of malignant neoplasm of digestive organs: Secondary | ICD-10-CM | POA: Insufficient documentation

## 2016-01-08 DIAGNOSIS — N950 Postmenopausal bleeding: Secondary | ICD-10-CM | POA: Insufficient documentation

## 2016-01-08 DIAGNOSIS — Z8041 Family history of malignant neoplasm of ovary: Secondary | ICD-10-CM | POA: Insufficient documentation

## 2016-01-08 DIAGNOSIS — Z9049 Acquired absence of other specified parts of digestive tract: Secondary | ICD-10-CM | POA: Insufficient documentation

## 2016-01-08 DIAGNOSIS — G43909 Migraine, unspecified, not intractable, without status migrainosus: Secondary | ICD-10-CM | POA: Diagnosis not present

## 2016-01-08 DIAGNOSIS — R938 Abnormal findings on diagnostic imaging of other specified body structures: Secondary | ICD-10-CM | POA: Insufficient documentation

## 2016-01-08 DIAGNOSIS — D250 Submucous leiomyoma of uterus: Secondary | ICD-10-CM | POA: Insufficient documentation

## 2016-01-08 DIAGNOSIS — Z880 Allergy status to penicillin: Secondary | ICD-10-CM | POA: Diagnosis not present

## 2016-01-08 DIAGNOSIS — Z9889 Other specified postprocedural states: Secondary | ICD-10-CM

## 2016-01-08 HISTORY — PX: HYSTEROSCOPY W/D&C: SHX1775

## 2016-01-08 SURGERY — DILATATION AND CURETTAGE /HYSTEROSCOPY
Anesthesia: General

## 2016-01-08 MED ORDER — PROPOFOL 10 MG/ML IV BOLUS
INTRAVENOUS | Status: DC | PRN
  Administered 2016-01-08: 50 mg via INTRAVENOUS
  Administered 2016-01-08: 130 mg via INTRAVENOUS

## 2016-01-08 MED ORDER — LACTATED RINGERS IV SOLN
INTRAVENOUS | Status: DC
  Administered 2016-01-08 (×2): via INTRAVENOUS

## 2016-01-08 MED ORDER — HYDROCODONE-ACETAMINOPHEN 5-325 MG PO TABS
1.0000 | ORAL_TABLET | Freq: Once | ORAL | Status: AC
  Administered 2016-01-08: 1 via ORAL

## 2016-01-08 MED ORDER — ONDANSETRON HCL 4 MG/2ML IJ SOLN
INTRAMUSCULAR | Status: DC | PRN
  Administered 2016-01-08: 4 mg via INTRAVENOUS

## 2016-01-08 MED ORDER — FENTANYL CITRATE (PF) 100 MCG/2ML IJ SOLN
INTRAMUSCULAR | Status: DC | PRN
  Administered 2016-01-08: 50 ug via INTRAVENOUS

## 2016-01-08 MED ORDER — DEXAMETHASONE SODIUM PHOSPHATE 10 MG/ML IJ SOLN
INTRAMUSCULAR | Status: DC | PRN
  Administered 2016-01-08: 5 mg via INTRAVENOUS

## 2016-01-08 MED ORDER — LIDOCAINE HCL (CARDIAC) 20 MG/ML IV SOLN
INTRAVENOUS | Status: DC | PRN
  Administered 2016-01-08: 80 mg via INTRAVENOUS

## 2016-01-08 MED ORDER — HYDROCODONE-ACETAMINOPHEN 5-300 MG PO TABS: 1.0000 | ORAL_TABLET | Freq: Every day | ORAL | 0 refills | Status: DC

## 2016-01-08 MED ORDER — FENTANYL CITRATE (PF) 100 MCG/2ML IJ SOLN
INTRAMUSCULAR | Status: AC
  Administered 2016-01-08: 50 ug via INTRAVENOUS
  Filled 2016-01-08: qty 2

## 2016-01-08 MED ORDER — FENTANYL CITRATE (PF) 100 MCG/2ML IJ SOLN
25.0000 ug | INTRAMUSCULAR | Status: DC | PRN
  Administered 2016-01-08 (×2): 50 ug via INTRAVENOUS

## 2016-01-08 MED ORDER — HYDROCODONE-ACETAMINOPHEN 5-325 MG PO TABS
ORAL_TABLET | ORAL | Status: AC
  Filled 2016-01-08: qty 1

## 2016-01-08 SURGICAL SUPPLY — 11 items
CATH ROBINSON RED A/P 16FR (CATHETERS) ×3 IMPLANT
GLOVE BIO SURGEON STRL SZ8 (GLOVE) ×12 IMPLANT
GOWN STRL REUS W/ TWL LRG LVL3 (GOWN DISPOSABLE) ×1 IMPLANT
GOWN STRL REUS W/ TWL XL LVL3 (GOWN DISPOSABLE) ×1 IMPLANT
GOWN STRL REUS W/TWL LRG LVL3 (GOWN DISPOSABLE) ×2
GOWN STRL REUS W/TWL XL LVL3 (GOWN DISPOSABLE) ×2
IV LACTATED RINGERS 1000ML (IV SOLUTION) ×3 IMPLANT
KIT RM TURNOVER CYSTO AR (KITS) ×3 IMPLANT
PACK DNC HYST (MISCELLANEOUS) ×3 IMPLANT
PAD OB MATERNITY 4.3X12.25 (PERSONAL CARE ITEMS) ×3 IMPLANT
PAD PREP 24X41 OB/GYN DISP (PERSONAL CARE ITEMS) ×3 IMPLANT

## 2016-01-08 NOTE — Interval H&P Note (Signed)
History and Physical Interval Note:  01/08/2016 9:54 AM  Danielle Richardson  has presented today for surgery, with the diagnosis of ENDOMETRIAL POLYP, ENDOMETRIAL THICKENING, PELVIC PAIN  The various methods of treatment have been discussed with the patient and family. After consideration of risks, benefits and other options for treatment, the patient has consented to  Procedure(s): DILATATION AND CURETTAGE /HYSTEROSCOPY (N/A) as a surgical intervention .  The patient's history has been reviewed, patient examined, no change in status, stable for surgery.  I have reviewed the patient's chart and labs.  Questions were answered to the patient's satisfaction.     Hassell Done A Stepfon Rawles

## 2016-01-08 NOTE — Discharge Instructions (Signed)

## 2016-01-08 NOTE — Transfer of Care (Signed)
Immediate Anesthesia Transfer of Care Note  Patient: Danielle Richardson  Procedure(s) Performed: Procedure(s): DILATATION AND CURETTAGE /HYSTEROSCOPY (N/A)  Patient Location: PACU  Anesthesia Type:General  Level of Consciousness: sedated  Airway & Oxygen Therapy: Patient Spontanous Breathing and Patient connected to nasal cannula oxygen  Post-op Assessment: Report given to RN and Post -op Vital signs reviewed and stable  Post vital signs: Reviewed and stable  Last Vitals:  Vitals:   01/08/16 0910 01/08/16 1115  BP: (!) 169/95   Pulse: 74   Resp: 16   Temp: 36.9 C (!) (P) 36 C    Last Pain:  Vitals:   01/08/16 0910  TempSrc: Oral  PainSc: 5          Complications: No apparent anesthesia complications

## 2016-01-08 NOTE — Anesthesia Postprocedure Evaluation (Signed)
Anesthesia Post Note  Patient: Daneille S [place]  Procedure(s) Performed: Procedure(s) (LRB): DILATATION AND CURETTAGE /HYSTEROSCOPY (N/A)  Patient location during evaluation: PACU Anesthesia Type: General Level of consciousness: awake and alert and oriented Pain management: pain level controlled Vital Signs Assessment: post-procedure vital signs reviewed and stable Respiratory status: spontaneous breathing, nonlabored ventilation and respiratory function stable Cardiovascular status: blood pressure returned to baseline and stable Postop Assessment: no signs of nausea or vomiting Anesthetic complications: no    Last Vitals:  Vitals:   01/08/16 1115 01/08/16 1130  BP: (!) 159/97 (!) 146/90  Pulse: 66 66  Resp: 17 11  Temp: (!) 36 C     Last Pain:  Vitals:   01/08/16 1115  TempSrc:   PainSc: Asleep                 Harrold Fitchett

## 2016-01-08 NOTE — H&P (View-Only) (Signed)
Subjective: PREOPERATIVE HISTORY AND PHYSICAL   Date of surgery: 01/08/2016 Procedure: Hysteroscopy/D&C   Patient is a 70 y.o. G2P2086female scheduled Hysteroscopy/D&C for evaluation of thickened endometrium on ultrasound, endometrial polyps, and pelvic pain. Preoperative endometrial biopsy did demonstrate fragments of endometrial polyps. The patient is still experiencing atypical pelvic pain with shooting spasm Symptoms. Colonoscopy in 2015 was normal; bowel function is normal. Patient does have urinary frequency but symptoms with urination do not mimic the pelvic pain symptoms. Trial of oxybutynin had been discontinued at 5 mg daily dosage because of atypical chest pain symptoms.  Surgery initially was to be performed in May 2017 but was postponed because of abnormal EKG. Patient has since gotten cardiac clearance for surgery                 Menstrual History: OB History    Gravida Para Term Preterm AB TAB SAB Ectopic Multiple Living   2 2 2       2       Menarche age: NA No LMP recorded. Patient is postmenopausal.        Past Medical History  Diagnosis Date  . Migraine   . Vertigo   . Joint pain   . Pelvic pain in female   . GERD (gastroesophageal reflux disease)   . SUI (stress urinary incontinence, female)           Past Surgical History  Procedure Laterality Date  . Augmentation mammaplasty Bilateral     1980  . Augmentation mammaplasty Bilateral     implants removed 1993  . Bladder tack  1999  . Cholecystectomy  1994  . Toe surgery    . Neck surgery    . Shoulder surgery                           OB History  Gravida Para Term Preterm AB SAB TAB Ectopic Multiple Living  2 2 2       2     # Outcome Date GA Lbr Len/2nd Weight Sex Delivery Anes PTL Lv  2 Term 1972   7 lb 1.8 oz (3.225 kg) F Vag-Spont   Y  1 Term 1968   6 lb 11.2 oz (3.039 kg) M Vag-Spont   Y      Social History        Social  History  . Marital Status: Married    Spouse Name: N/A  . Number of Children: N/A  . Years of Education: N/A         Social History Main Topics  . Smoking status: Former Smoker    Quit date: 04/02/1999  . Smokeless tobacco: None  . Alcohol Use: Yes     Comment: rare  . Drug Use: No  . Sexual Activity: No       Other Topics Concern  . None   Social History Narrative         Family History  Problem Relation Age of Onset  . Diabetes Father   . Diabetes Brother   . Colon cancer Maternal Uncle   . Ovarian cancer Maternal Grandfather   . Breast cancer Neg Hx      (Not in a hospital admission)      Allergies  Allergen Reactions  . Alendronate Nausea Only  . Codeine Other (See Comments)  . Penicillins Other (See Comments)  . Rofecoxib Other (See Comments)  . Oseltamivir Rash    Review of Systems Constitutional:  No recent fever/chills/sweats Respiratory: No recent cough/bronchitis Cardiovascular: No chest pain Gastrointestinal: No recent nausea/vomiting/diarrhea Genitourinary: No UTI symptoms Hematologic/lymphatic:No history of coagulopathy or recent blood thinner use    Objective:   BP (!) 171/85   Pulse 81   Ht 5' 5.5" (1.664 m)   Wt 180 lb 4.8 oz (81.8 kg)   BMI 29.55 kg/m    General:   Normal  Skin:   normal  HEENT:  Normal  Neck:  Supple without Adenopathy or Thyromegaly  Lungs:   Heart:              Breasts:   Abdomen:  Pelvis:  M/S   Extremeties:  Neuro:    clear to auscultation bilaterally   Normal without murmur   Not Examined   soft, non-tender; bowel sounds normal; no masses,  no organomegaly   Exam deferred to OR  No CVAT  Warm/Dry  Normal          Assessment:    1. Thickened endometrium on ultrasound 2. Endometrial biopsy consistent with endometrial polyp fragments 3. Atypical pelvic pain Plan:   1. Hysteroscopy/D&C  Prep counseling: Patient is to undergo hysteroscopy/D&C for  thickened endometrium on ultrasound and findings of endometrial polyps on biopsy. She is understanding of the planned procedure and is aware of and is uncertain of all surgical risks to include but are not limited to bleeding, infection, pelvic organ injury with need for repair, blood clot disorders, anesthesia risks, etc. All questions have been answered. Informed consent is given. Patient is ready and willing to proceed with surgery as scheduled.  Brayton Mars, MD  Note: This dictation was prepared with Dragon dictation along with smaller phrase technology. Any transcriptional errors that result from this process are unintentional.

## 2016-01-08 NOTE — Anesthesia Preprocedure Evaluation (Signed)
Anesthesia Evaluation  Patient identified by MRN, date of birth, ID band Patient awake    Reviewed: Allergy & Precautions, H&P , NPO status , Patient's Chart, lab work & pertinent test results  History of Anesthesia Complications Negative for: history of anesthetic complications  Airway Mallampati: III  TM Distance: <3 FB Neck ROM: limited    Dental  (+) Poor Dentition   Pulmonary neg shortness of breath, COPD, former smoker,    Pulmonary exam normal breath sounds clear to auscultation       Cardiovascular Exercise Tolerance: Good hypertension, (-) angina+ CAD  (-) Past MI and (-) DOE Normal cardiovascular exam+ dysrhythmias  Rhythm:regular Rate:Normal     Neuro/Psych  Headaches, negative psych ROS   GI/Hepatic Neg liver ROS, GERD  Controlled,  Endo/Other  negative endocrine ROS  Renal/GU      Musculoskeletal  (+) Arthritis ,   Abdominal   Peds  Hematology negative hematology ROS (+)   Anesthesia Other Findings Past Medical History: No date: Arthritis No date: GERD (gastroesophageal reflux disease) No date: Joint pain No date: Left bundle branch block No date: Migraine No date: Pelvic pain in female No date: SUI (stress urinary incontinence, female) No date: Vertigo  Past Surgical History: No date: AUGMENTATION MAMMAPLASTY Bilateral     Comment: 1976 No date: AUGMENTATION MAMMAPLASTY Bilateral     Comment: implants removed 1993 1999: bladder tack 09/12/2015: CARDIAC CATHETERIZATION N/A     Comment: Procedure: Left Heart Cath and Coronary               Angiography;  Surgeon: Corey Skains, MD;                Location: [facility];  Service:               Cardiovascular;  Laterality: N/A; 1994: CHOLECYSTECTOMY 2010: JOINT REPLACEMENT     Comment: right shoulder 2010: NECK SURGERY     Comment: Cervical Discectomy with fusion & plating 2010: SHOULDER SURGERY Right     Comment: Total  Shoulder Replacement, [facility] No date: TOE SURGERY     Reproductive/Obstetrics negative OB ROS                             Anesthesia Physical Anesthesia Plan  ASA: III  Anesthesia Plan: General LMA   Post-op Pain Management:    Induction:   Airway Management Planned:   Additional Equipment:   Intra-op Plan:   Post-operative Plan:   Informed Consent: I have reviewed the patients History and Physical, chart, labs and discussed the procedure including the risks, benefits and alternatives for the proposed anesthesia with the patient or authorized representative who has indicated his/her understanding and acceptance.   Dental Advisory Given  Plan Discussed with: Anesthesiologist, CRNA and Surgeon  Anesthesia Plan Comments:         Anesthesia Quick Evaluation

## 2016-01-08 NOTE — Anesthesia Procedure Notes (Signed)
Procedure Name: LMA Insertion Date/Time: 01/08/2016 10:29 AM Performed by: [facility] Pre-anesthesia Checklist: Patient identified, Emergency Drugs available, Suction available and Patient being monitored Patient Re-evaluated:Patient Re-evaluated prior to inductionOxygen Delivery Method: Circle system utilized Preoxygenation: Pre-oxygenation with 100% oxygen Intubation Type: IV induction Ventilation: Mask ventilation without difficulty LMA: LMA inserted LMA Size: 3.5 Number of attempts: 1 Placement Confirmation: positive ETCO2 and CO2 detector

## 2016-01-08 NOTE — Op Note (Signed)
OPERATIVE NOTE:  Danielle Richardson PROCEDURE DATE: 01/08/2016   PREOPERATIVE DIAGNOSIS:  1. Postmenopausal bleeding 2. Endometrial polyps  POSTOPERATIVE DIAGNOSIS:  1. Postmenopausal bleeding  2. Endometrial polyps 3. Submucosal fibroid 0.5 cm  PROCEDURE:  Hysteroscopy/D&C  SURGEON:  Brayton Mars, MD ASSISTANTS: Arlyss Gandy, PA-S ANESTHESIA: General INDICATIONS: 70 y.o. [phone] with postmenopausal bleeding, previous biopsy cystoscopy with fragment of endometrial polyp, presents for surgical management FINDINGS:   TWO 2 cm polyps removed; 0.5 cm submucosal fibroid noted posteriorly in the lower uterine segment; no other intrauterine pathology seen   I/O's: No intake/output data recorded. COUNTS:  YES SPECIMENS: Endometrial curettings with endometrial polyps ANTIBIOTIC PROPHYLAXIS:N/A  COMPLICATIONS: None immediate  PROCEDURE IN DETAIL: patient was brought to the operating room and placed in the supine position. General anesthesia with LMA technique was utilized. She was placed in the dorsal lithotomy position using candycane stirrups. A Betadine perineal intravaginal prep and drape was performed in standard fashion. Timeout was completed. Weighted speculum was placed in the vagina. Single-tooth tenaculum was placed on the anterior lip of the cervix. Uterus was sounded to 8 cm. The Hanks dilators were used up to a 20 [place] caliber to dilate the endocervical canal. The [facility] hysteroscope using lactated Ringer's as irrigant was used to assess the intrauterine cavity. Photodocumentation verified to endometrial polyps, measuring approximately 1 centimeter in diameter. These were removed with stone polyp forceps. Smooth and serrated curettes were then used to curettage the endometrial cavity with a good cry being obtained. Repeat hysteroscopy demonstrated no significant tissue was left behind. The posterior uterine submucosafibroid in the lower uterine segment was noted at this  time. Procedure was then terminated with all instrumentation being removed from the vagina. Patient was then awakened mobilized and taken to the recovery room in satisfactory condition  Yakov Bergen A. Danielle Plants, MD, A[facility]

## 2016-01-09 LAB — SURGICAL PATHOLOGY

## 2016-01-16 ENCOUNTER — Ambulatory Visit (INDEPENDENT_AMBULATORY_CARE_PROVIDER_SITE_OTHER): Payer: Medicare Other | Admitting: Obstetrics and Gynecology

## 2016-01-16 ENCOUNTER — Encounter: Payer: Self-pay | Admitting: Obstetrics and Gynecology

## 2016-01-16 VITALS — BP 148/73 | HR 76 | Ht 65.5 in | Wt 181.5 lb

## 2016-01-16 DIAGNOSIS — R938 Abnormal findings on diagnostic imaging of other specified body structures: Secondary | ICD-10-CM

## 2016-01-16 DIAGNOSIS — N840 Polyp of corpus uteri: Secondary | ICD-10-CM

## 2016-01-16 DIAGNOSIS — R9389 Abnormal findings on diagnostic imaging of other specified body structures: Secondary | ICD-10-CM

## 2016-01-16 NOTE — Patient Instructions (Addendum)
1. Return in 1 year for f/u

## 2016-01-16 NOTE — Progress Notes (Signed)
Chief complaint: 1. One week postop check 2. Status post hysteroscopy/D&C 3. History of endometrial polyps  Patient presents 1 week after surgery for follow-up She had minimal cramping. She is not experiencing any  Vaginal bleeding.  Pathology:  DIAGNOSIS:  A. ENDOMETRIAL POLYP; BIOPSY:  - ENDOMETRIAL POLYP.  - NEGATIVE FOR ATYPIA AND MALIGNANCY.   B. ENDOMETRIAL CURETTINGS:  - STRIPS OF IN ACTIVE ENDOMETRIUM.  - UNREMARKABLE ENDOCERVICAL GLANDULAR EPITHELIUM.  - UNREMARKABLE SQUAMOUS MUCOSA.  - NEGATIVE FOR ATYPIA AND MALIGNANCY.    OBJECTIVE: BP (!) 148/73   Pulse 76   Ht 5' 5.5" (1.664 m)   Wt 181 lb 8 oz (82.3 kg)   BMI 29.74 kg/m  Physical exam-deferred   ASSESSMENT: 1.Normal postop check 2. Status post hysteroscopy/D&C  PLAN: 1. Resume activities as tolerated 2. Return in 1 year for follow-up on PMB  Brayton Mars, MD  Note: This dictation was prepared with Dragon dictation along with smaller phrase technology. Any transcriptional errors that result from this process are unintentional.

## 2016-04-01 HISTORY — PX: CATARACT EXTRACTION W/ INTRAOCULAR LENS  IMPLANT, BILATERAL: SHX1307

## 2016-09-23 DIAGNOSIS — I2510 Atherosclerotic heart disease of native coronary artery without angina pectoris: Secondary | ICD-10-CM | POA: Insufficient documentation

## 2016-09-23 DIAGNOSIS — I10 Essential (primary) hypertension: Secondary | ICD-10-CM | POA: Insufficient documentation

## 2016-09-23 HISTORY — DX: Atherosclerotic heart disease of native coronary artery without angina pectoris: I25.10

## 2016-10-16 ENCOUNTER — Other Ambulatory Visit: Payer: Self-pay | Admitting: Internal Medicine

## 2016-10-16 DIAGNOSIS — Z1231 Encounter for screening mammogram for malignant neoplasm of breast: Secondary | ICD-10-CM

## 2016-10-30 ENCOUNTER — Ambulatory Visit
Admission: RE | Admit: 2016-10-30 | Discharge: 2016-10-30 | Disposition: A | Discharge status: Home / Self Care | Payer: Medicare Other | Source: Ambulatory Visit | Attending: Internal Medicine | Admitting: Internal Medicine

## 2016-10-30 DIAGNOSIS — Z1231 Encounter for screening mammogram for malignant neoplasm of breast: Secondary | ICD-10-CM | POA: Insufficient documentation

## 2017-01-16 ENCOUNTER — Ambulatory Visit (INDEPENDENT_AMBULATORY_CARE_PROVIDER_SITE_OTHER): Payer: Medicare Other | Admitting: Obstetrics and Gynecology

## 2017-01-16 ENCOUNTER — Encounter: Payer: Medicare Other | Admitting: Obstetrics and Gynecology

## 2017-01-16 ENCOUNTER — Encounter: Payer: Self-pay | Admitting: Obstetrics and Gynecology

## 2017-01-16 VITALS — BP 162/74 | HR 71 | Ht 65.5 in | Wt 180.0 lb

## 2017-01-16 DIAGNOSIS — Z1211 Encounter for screening for malignant neoplasm of colon: Secondary | ICD-10-CM

## 2017-01-16 DIAGNOSIS — Z01419 Encounter for gynecological examination (general) (routine) without abnormal findings: Secondary | ICD-10-CM | POA: Diagnosis not present

## 2017-01-16 DIAGNOSIS — N3946 Mixed incontinence: Secondary | ICD-10-CM

## 2017-01-16 DIAGNOSIS — R102 Pelvic and perineal pain: Secondary | ICD-10-CM | POA: Diagnosis not present

## 2017-01-16 DIAGNOSIS — N941 Unspecified dyspareunia: Secondary | ICD-10-CM

## 2017-01-16 DIAGNOSIS — N952 Postmenopausal atrophic vaginitis: Secondary | ICD-10-CM

## 2017-01-16 DIAGNOSIS — N3941 Urge incontinence: Secondary | ICD-10-CM

## 2017-01-16 MED ORDER — OXYBUTYNIN CHLORIDE 5 MG PO TABS: 5.0000 mg | ORAL_TABLET | Freq: Every day | ORAL | 3 refills | Status: DC

## 2017-01-16 MED ORDER — ESTROGENS, CONJUGATED 0.625 MG/GM VA CREA: 0.2500 | TOPICAL_CREAM | Freq: Every day | VAGINAL | 12 refills | Status: DC

## 2017-01-16 NOTE — Patient Instructions (Addendum)
1. No Pap smear is needed 2. Mammogram is already pleaded this year 3. Stool guaiac cards are given for colon cancer screening 4. Screening labs are through primary care 5. Continue with healthy eating and exercise 6. Consider urology referral for posterior tibial nerve stimulation for management of unstable bladder 7. Consider physical therapy consultation for management of chronic pelvic pain thought to be related to possible scarring from pubovaginal sling 8. Return in 1 year for annual exam   Health Maintenance for Postmenopausal Women Menopause is a normal process in which your reproductive ability comes to an end. This process happens gradually over a span of months to years, usually between the ages of 27 and 68. Menopause is complete when you have missed 12 consecutive menstrual periods. It is important to talk with your health care provider about some of the most common conditions that affect postmenopausal women, such as heart disease, cancer, and bone loss (osteoporosis). Adopting a healthy lifestyle and getting preventive care can help to promote your health and wellness. Those actions can also lower your chances of developing some of these common conditions. What should I know about menopause? During menopause, you may experience a number of symptoms, such as:  Moderate-to-severe hot flashes.  Night sweats.  Decrease in sex drive.  Mood swings.  Headaches.  Tiredness.  Irritability.  Memory problems.  Insomnia.  Choosing to treat or not to treat menopausal changes is an individual decision that you make with your health care provider. What should I know about hormone replacement therapy and supplements? Hormone therapy products are effective for treating symptoms that are associated with menopause, such as hot flashes and night sweats. Hormone replacement carries certain risks, especially as you become older. If you are thinking about using estrogen or estrogen with  progestin treatments, discuss the benefits and risks with your health care provider. What should I know about heart disease and stroke? Heart disease, heart attack, and stroke become more likely as you age. This may be due, in part, to the hormonal changes that your body experiences during menopause. These can affect how your body processes dietary fats, triglycerides, and cholesterol. Heart attack and stroke are both medical emergencies. There are many things that you can do to help prevent heart disease and stroke:  Have your blood pressure checked at least every 1-2 years. High blood pressure causes heart disease and increases the risk of stroke.  If you are 40-12 years old, ask your health care provider if you should take aspirin to prevent a heart attack or a stroke.  Do not use any tobacco products, including cigarettes, chewing tobacco, or electronic cigarettes. If you need help quitting, ask your health care provider.  It is important to eat a healthy diet and maintain a healthy weight. ? Be sure to include plenty of vegetables, fruits, low-fat dairy products, and lean protein. ? Avoid eating foods that are high in solid fats, added sugars, or salt (sodium).  Get regular exercise. This is one of the most important things that you can do for your health. ? Try to exercise for at least 150 minutes each week. The type of exercise that you do should increase your heart rate and make you sweat. This is known as moderate-intensity exercise. ? Try to do strengthening exercises at least twice each week. Do these in addition to the moderate-intensity exercise.  Know your numbers.Ask your health care provider to check your cholesterol and your blood glucose. Continue to have your blood tested  as directed by your health care provider.  What should I know about cancer screening? There are several types of cancer. Take the following steps to reduce your risk and to catch any cancer development as  early as possible. Breast Cancer  Practice breast self-awareness. ? This means understanding how your breasts normally appear and feel. ? It also means doing regular breast self-exams. Let your health care provider know about any changes, no matter how small.  If you are 97 or older, have a clinician do a breast exam (clinical breast exam or CBE) every year. Depending on your age, family history, and medical history, it may be recommended that you also have a yearly breast X-ray (mammogram).  If you have a family history of breast cancer, talk with your health care provider about genetic screening.  If you are at high risk for breast cancer, talk with your health care provider about having an MRI and a mammogram every year.  Breast cancer (BRCA) gene test is recommended for women who have family members with BRCA-related cancers. Results of the assessment will determine the need for genetic counseling and BRCA1 and for BRCA2 testing. BRCA-related cancers include these types: ? Breast. This occurs in males or females. ? Ovarian. ? Tubal. This may also be called fallopian tube cancer. ? Cancer of the abdominal or pelvic lining (peritoneal cancer). ? Prostate. ? Pancreatic.  Cervical, Uterine, and Ovarian Cancer Your health care provider may recommend that you be screened regularly for cancer of the pelvic organs. These include your ovaries, uterus, and vagina. This screening involves a pelvic exam, which includes checking for microscopic changes to the surface of your cervix (Pap test).  For women ages 21-65, health care providers may recommend a pelvic exam and a Pap test every three years. For women ages 68-65, they may recommend the Pap test and pelvic exam, combined with testing for human papilloma virus (HPV), every five years. Some types of HPV increase your risk of cervical cancer. Testing for HPV may also be done on women of any age who have unclear Pap test results.  Other health  care providers may not recommend any screening for nonpregnant women who are considered low risk for pelvic cancer and have no symptoms. Ask your health care provider if a screening pelvic exam is right for you.  If you have had past treatment for cervical cancer or a condition that could lead to cancer, you need Pap tests and screening for cancer for at least 20 years after your treatment. If Pap tests have been discontinued for you, your risk factors (such as having a new sexual partner) need to be reassessed to determine if you should start having screenings again. Some women have medical problems that increase the chance of getting cervical cancer. In these cases, your health care provider may recommend that you have screening and Pap tests more often.  If you have a family history of uterine cancer or ovarian cancer, talk with your health care provider about genetic screening.  If you have vaginal bleeding after reaching menopause, tell your health care provider.  There are currently no reliable tests available to screen for ovarian cancer.  Lung Cancer Lung cancer screening is recommended for adults 32-47 years old who are at high risk for lung cancer because of a history of smoking. A yearly low-dose CT scan of the lungs is recommended if you:  Currently smoke.  Have a history of at least 30 pack-years of smoking and you  currently smoke or have quit within the past 15 years. A pack-year is smoking an average of one pack of cigarettes per day for one year.  Yearly screening should:  Continue until it has been 15 years since you quit.  Stop if you develop a health problem that would prevent you from having lung cancer treatment.  Colorectal Cancer  This type of cancer can be detected and can often be prevented.  Routine colorectal cancer screening usually begins at age 57 and continues through age 68.  If you have risk factors for colon cancer, your health care provider may  recommend that you be screened at an earlier age.  If you have a family history of colorectal cancer, talk with your health care provider about genetic screening.  Your health care provider may also recommend using home test kits to check for hidden blood in your stool.  A small camera at the end of a tube can be used to examine your colon directly (sigmoidoscopy or colonoscopy). This is done to check for the earliest forms of colorectal cancer.  Direct examination of the colon should be repeated every 5-10 years until age 50. However, if early forms of precancerous polyps or small growths are found or if you have a family history or genetic risk for colorectal cancer, you may need to be screened more often.  Skin Cancer  Check your skin from head to toe regularly.  Monitor any moles. Be sure to tell your health care provider: ? About any new moles or changes in moles, especially if there is a change in a mole's shape or color. ? If you have a mole that is larger than the size of a pencil eraser.  If any of your family members has a history of skin cancer, especially at a young age, talk with your health care provider about genetic screening.  Always use sunscreen. Apply sunscreen liberally and repeatedly throughout the day.  Whenever you are outside, protect yourself by wearing long sleeves, pants, a wide-brimmed hat, and sunglasses.  What should I know about osteoporosis? Osteoporosis is a condition in which bone destruction happens more quickly than new bone creation. After menopause, you may be at an increased risk for osteoporosis. To help prevent osteoporosis or the bone fractures that can happen because of osteoporosis, the following is recommended:  If you are 8-14 years old, get at least 1,000 mg of calcium and at least 600 mg of vitamin D per day.  If you are older than age 25 but younger than age 45, get at least 1,200 mg of calcium and at least 600 mg of vitamin D per  day.  If you are older than age 60, get at least 1,200 mg of calcium and at least 800 mg of vitamin D per day.  Smoking and excessive alcohol intake increase the risk of osteoporosis. Eat foods that are rich in calcium and vitamin D, and do weight-bearing exercises several times each week as directed by your health care provider. What should I know about how menopause affects my mental health? Depression may occur at any age, but it is more common as you become older. Common symptoms of depression include:  Low or sad mood.  Changes in sleep patterns.  Changes in appetite or eating patterns.  Feeling an overall lack of motivation or enjoyment of activities that you previously enjoyed.  Frequent crying spells.  Talk with your health care provider if you think that you are experiencing depression. What  should I know about immunizations? It is important that you get and maintain your immunizations. These include:  Tetanus, diphtheria, and pertussis (Tdap) booster vaccine.  Influenza every year before the flu season begins.  Pneumonia vaccine.  Shingles vaccine.  Your health care provider may also recommend other immunizations. This information is not intended to replace advice given to you by your health care provider. Make sure you discuss any questions you have with your health care provider. Document Released: 05/10/2005 Document Revised: 10/06/2015 Document Reviewed: 12/20/2014 Elsevier Interactive Patient Education  2018 [employer].

## 2017-01-16 NOTE — Progress Notes (Signed)
ANNUAL PREVENTATIVE CARE GYN  ENCOUNTER NOTE  Subjective:       Danielle Richardson is a 71 y.o. G61P2002 female here for a routine annual gynecologic exam.  Current complaints: 1. Pelvic pain- left side  Patient reports persistence of left lower quadrant/vaginal pain that intermittently flares. Sometimes just walking and bending or lifting can cause exacerbation of symptoms. The pain is deep in the groin region and vagina. Patient is status post a sling for incontinence years ago. Intercourse is rare and painful. Bowel function is stable without change in symptomatology. No blood per rectum. Patient does have urinary frequency and nocturia for which she is on oxybutynin. She is taking this medication one half tablet 5 mg at bedtime or every other night.   Gynecologic History No LMP recorded. Patient is postmenopausal. Contraception: post menopausal status Last WUJ:WJXBJY no h/o anb. Results were: normal Last mammogram: 10/2016 birad 1. Results were: normal  Obstetric History OB History  Gravida Para Term Preterm AB Living  2 2 2     2   SAB TAB Ectopic Multiple Live Births          2    # Outcome Date GA Lbr Len/2nd Weight Sex Delivery Anes PTL Lv  2 Term 1972   7 lb 1.8 oz (3.225 kg) F Vag-Spont   LIV  1 Term 1968   6 lb 11.2 oz (3.039 kg) M Vag-Spont   LIV      Past Medical History:  Diagnosis Date  . Arthritis   . GERD (gastroesophageal reflux disease)   . Joint pain   . Left bundle branch block   . Migraine   . Pelvic pain in female   . SUI (stress urinary incontinence, female)   . Vertigo     Past Surgical History:  Procedure Laterality Date  . AUGMENTATION MAMMAPLASTY Bilateral    Pt had implants and then removed  . bladder tack  1999  . CARDIAC CATHETERIZATION N/A 09/12/2015   Procedure: Left Heart Cath and Coronary Angiography;  Surgeon: Corey Skains, MD;  Location: [facility];  Service: Cardiovascular;  Laterality: N/A;  . CHOLECYSTECTOMY  1994  .  HYSTEROSCOPY W/D&C N/A 01/08/2016   Procedure: DILATATION AND CURETTAGE /HYSTEROSCOPY;  Surgeon: Brayton Mars, MD;  Location: [facility];  Service: Gynecology;  Laterality: N/A;  . JOINT REPLACEMENT  2010   right shoulder  . NECK SURGERY  2010   Cervical Discectomy with fusion & plating  . SHOULDER SURGERY Right 2010   Total Shoulder Replacement, [facility]  . TOE SURGERY      Current Outpatient Prescriptions on File Prior to Visit  Medication Sig Dispense Refill  . aspirin EC 81 MG tablet Take 81 mg by mouth daily.     . Calcium-Vitamin D-Vitamin K 500-100-40 MG-UNT-MCG CHEW Chew 1 Dose by mouth daily.    . clindamycin (CLEOCIN) 150 MG capsule Take 150 mg by mouth as needed. 4 tablets prior to dental appointment    . conjugated estrogens (PREMARIN) vaginal cream Place 7.82 Applicatorfuls vaginally daily. 1/2 gram IV twice weekly. (Patient taking differently: Place 1 Applicatorful vaginally 2 (two) times a week. ) 42.5 g 12  . fluticasone (FLONASE) 50 MCG/ACT nasal spray Place 2 sprays into the nose at bedtime.     . meclizine (ANTIVERT) 25 MG tablet Take 25 mg by mouth 3 (three) times daily as needed.     . meloxicam (MOBIC) 7.5 MG tablet Take 7.5 mg by mouth daily.  May take twice a day if needed    . Multiple Vitamins-Minerals (MULTIVITAMIN GUMMIES ADULT PO) Take 1 Dose by mouth daily.    . pantoprazole (PROTONIX) 40 MG tablet Take 40 mg by mouth 2 (two) times daily.     . polyethylene glycol powder (GLYCOLAX/MIRALAX) powder Take by mouth as needed.     . rizatriptan (MAXALT) 10 MG tablet Take 10 mg by mouth as needed.      No current facility-administered medications on file prior to visit.     Allergies  Allergen Reactions  . Penicillins Rash  . Pravastatin Other (See Comments)  . Alendronate Nausea Only  . Codeine Nausea Only  . Oseltamivir Rash  . Rofecoxib Swelling    Swelling of feet    Social History   Social History  . Marital status: Married    Spouse  name: N/A  . Number of children: N/A  . Years of education: N/A   Occupational History  . Not on file.   Social History Main Topics  . Smoking status: Former Smoker    Quit date: 04/02/1999  . Smokeless tobacco: Never Used  . Alcohol use Yes     Comment: rare  . Drug use: No  . Sexual activity: No   Other Topics Concern  . Not on file   Social History Narrative  . No narrative on file    Family History  Problem Relation Age of Onset  . Diabetes Father   . Diabetes Brother   . Colon cancer Maternal Uncle   . Ovarian cancer Maternal Grandfather   . Breast cancer Neg Hx     The following portions of the patient's history were reviewed and updated as appropriate: allergies, current medications, past family history, past medical history, past social history, past surgical history and problem list.  Review of Systems ROS Review of Systems - General ROS: negative for - chills, fatigue, fever, hot flashes, night sweats, weight gain or weight loss Psychological ROS: negative for - anxiety, decreased libido, depression, mood swings, physical abuse or sexual abuse Ophthalmic ROS: negative for - blurry vision, eye pain or loss of vision ENT ROS: negative for - headaches, hearing change, visual changes or vocal changes Allergy and Immunology ROS: negative for - hives, itchy/watery eyes or seasonal allergies Hematological and Lymphatic ROS: negative for - bleeding problems, bruising, swollen lymph nodes or weight loss Endocrine ROS: negative for - galactorrhea, hair pattern changes, hot flashes, malaise/lethargy, mood swings, palpitations, polydipsia/polyuria, skin changes, temperature intolerance or unexpected weight changes Breast ROS: negative for - new or changing breast lumps or nipple discharge Respiratory ROS: negative for - cough or shortness of breath Cardiovascular ROS: negative for - chest pain, irregular heartbeat, palpitations or shortness of breath Gastrointestinal ROS: no  abdominal pain, change in bowel habits, or black or bloody stools Genito-Urinary ROS: no dysuria, trouble voiding, or hematuria Musculoskeletal ROS: negative for - joint pain or joint stiffness Neurological ROS: negative for - bowel and bladder control changes Dermatological ROS: negative for rash and skin lesion changes   Objective:   BP (!) 162/74   Pulse 71   Ht 5' 5.5" (1.664 m)   Wt 180 lb (81.6 kg)   BMI 29.50 kg/m  CONSTITUTIONAL: Well-developed, well-nourished female in no acute distress.  PSYCHIATRIC: Normal mood and affect. Normal behavior. Normal judgment and thought content. [place]: Alert and oriented to person, place, and time. Normal muscle tone coordination. No cranial nerve deficit noted. HENT:  Normocephalic, atraumatic, External right  and left ear normal. Oropharynx is clear and moist EYES: Conjunctivae and EOM are normal.No scleral icterus.  NECK: Normal range of motion, supple, no masses.  Normal thyroid.  SKIN: Skin is warm and dry. No rash noted. Not diaphoretic. No erythema. No pallor. CARDIOVASCULAR: Normal heart rate noted, regular rhythm, no murmur. RESPIRATORY: Clear to auscultation bilaterally. Effort and breath sounds normal, no problems with respiration noted. BREASTS: Symmetric in size. No masses, skin changes, nipple drainage, or lymphadenopathy. ABDOMEN: Soft, normal bowel sounds, no distention noted.  No tenderness, rebound or guarding.  BLADDER: Normal PELVIC:  External Genitalia: Normal  BUS: Normal  Vagina: Fair estrogen effect; bimanual reveals tenderness in distal anterior vagina and revision of pubovaginal sling, more prominent on left side which correlates with her chronic pain  Cervix: Normal; no lesions; no cervical motion tenderness  Uterus: Normal; midplane, normal size and shape, mobile, nontender  Adnexa: Normal; nonpalpable nontender  RV: External Exam NormaI, No Rectal Masses and Normal Sphincter tone  MUSCULOSKELETAL: Limited  range of motion of right arm.  No cyanosis, clubbing, or edema.  2+ distal pulses. LYMPHATIC: No Axillary, Supraclavicular, or Inguinal Adenopathy.    Assessment:   Annual gynecologic examination 71 y.o. Contraception: post menopausal status Overweight Unstable bladder, under fair control with oxybutynin therapy 5 mg every other night Pelvic pain, left inguinal/vaginal region, likely related to pubovaginal sling scarring  Plan:  Pap: Not needed Mammogram: Not Indicated Stool Guaiac Testing:  Ordered Labs: thur pcp Routine preventative health maintenance measures emphasized: Exercise/Diet/Weight control, Tobacco Warnings and Alcohol/Substance use risks  Refill oxybutynin 5 mg every night to every other night Consider physical therapy consult for chronic pelvic pain management thought to be secondary to the pubovaginal sling scarring Consider posterior tibial nerve innervation therapy for management of unstable bladder; urology consultation would be necessary. Return to [place], [place]  Brayton Mars, MD  Note: This dictation was prepared with Dragon dictation along with smaller phrase technology. Any transcriptional errors that result from this process are unintentional.

## 2017-01-17 ENCOUNTER — Other Ambulatory Visit: Payer: Self-pay

## 2017-01-17 MED ORDER — ESTROGENS, CONJUGATED 0.625 MG/GM VA CREA: 0.2500 | TOPICAL_CREAM | VAGINAL | 12 refills | Status: DC

## 2017-01-26 LAB — SPECIMEN STATUS REPORT

## 2017-01-26 LAB — FECAL OCCULT BLOOD, IMMUNOCHEMICAL: FECAL OCCULT BLD: POSITIVE — AB

## 2017-01-27 ENCOUNTER — Telehealth: Payer: Self-pay

## 2017-01-27 DIAGNOSIS — R195 Other fecal abnormalities: Secondary | ICD-10-CM

## 2017-01-27 NOTE — Telephone Encounter (Signed)
-----   Message from Brayton Mars, MD sent at 01/27/2017  2:46 PM EDT ----- Please notify - Abnormal Labs Please schedule GI referral

## 2017-01-27 NOTE — Telephone Encounter (Signed)
Pt aware- Referral placed.  

## 2017-02-12 ENCOUNTER — Other Ambulatory Visit: Payer: Self-pay

## 2017-02-12 ENCOUNTER — Encounter: Payer: Self-pay | Admitting: Gastroenterology

## 2017-02-12 ENCOUNTER — Ambulatory Visit: Payer: Medicare Other | Admitting: Gastroenterology

## 2017-02-12 VITALS — BP 138/82 | HR 72 | Temp 98.3°F | Ht 65.0 in | Wt 178.4 lb

## 2017-02-12 DIAGNOSIS — R195 Other fecal abnormalities: Secondary | ICD-10-CM | POA: Diagnosis not present

## 2017-02-12 DIAGNOSIS — K2270 Barrett's esophagus without dysplasia: Secondary | ICD-10-CM

## 2017-02-12 NOTE — Progress Notes (Signed)
[name], MD [address] [place], [place], [place], [address] [address], [place], [place], [place], [address] Phone: [phone]  Fax: [phone]  Consultation  Referring Provider:     [name], MD Primary Care Physician:  [name], MD Primary Gastroenterologist:  Virgel Manifold, MD        Reason for Referral:    FOBT positive  Date of Consultation:  02/12/2017         HPI:   Danielle Richardson is a 71 y.o. female referred due to positive FOBT testing done by her Gyn as part of her annual exam. No gross blood in stool, altered bowel habits, weight loss or alarm symptoms. No immediate family hx of colon cancer. Pt. Had a colonoscopy and EGD in Feb 2015 by Dr. Gustavo Lah for positive FOBT that revealed a good prep, exam complete to the cecum, and 2 TA polyps removed, 1 hyperplastic polyp, diverticulosis and repeat recommended in 5 yrs. GE junction biopsies showed columnar epithelim and rare goblet cell, gastric biopsy did not reveal H Pylori. She is on protonix daily which she states controls her heartburn symptoms she had before. No dysphagia.   Past Medical History:  Diagnosis Date  . Arthritis   . Cataract   . GERD (gastroesophageal reflux disease)   . Joint pain   . Left bundle branch block   . Migraine   . Pelvic pain in female   . SUI (stress urinary incontinence, female)   . Vertigo     Past Surgical History:  Procedure Laterality Date  . AUGMENTATION MAMMAPLASTY Bilateral    Pt had implants and then removed  . bladder tack  1999  . CHOLECYSTECTOMY  1994  . EYE SURGERY    . JOINT REPLACEMENT  2010   right shoulder  . NECK SURGERY  2010   Cervical Discectomy with fusion & plating  . SHOULDER SURGERY Right 2010   Total Shoulder Replacement, [facility]  . TOE SURGERY      Prior to Admission medications   Medication Sig Start Date End Date Taking? Authorizing Provider  aspirin EC 81 MG tablet Take 81 mg by mouth daily.     Yes [provider]  Calcium-Vitamin D-Vitamin K 500-100-40 MG-UNT-MCG CHEW Chew 1 Dose by mouth daily.   Yes [provider]  conjugated estrogens (PREMARIN) vaginal cream Place 1.54 Applicatorfuls vaginally 2 (two) times a week. 01/20/17  Yes Defrancesco, [name], MD  fluticasone (FLONASE) 50 MCG/ACT nasal spray Place 2 sprays into the nose at bedtime.  03/28/15  Yes [provider]  meclizine (ANTIVERT) 25 MG tablet Take 25 mg by mouth 3 (three) times daily as needed.  09/14/13  Yes [provider]  meloxicam (MOBIC) 7.5 MG tablet Take 7.5 mg by mouth daily. May take twice a day if needed 03/28/15  Yes [provider]  Multiple Vitamins-Minerals (MULTIVITAMIN GUMMIES ADULT PO) Take 1 Dose by mouth daily.   Yes [provider]  oxybutynin (DITROPAN) 5 MG tablet Take 1 tablet (5 mg total) by mouth at bedtime. 01/16/17  Yes Defrancesco, [name], MD  pantoprazole (PROTONIX) 40 MG tablet Take 40 mg by mouth 2 (two) times daily.  03/28/15  Yes [provider]  polyethylene glycol powder (GLYCOLAX/MIRALAX) powder Take by mouth as needed.  09/14/13  Yes [provider]  rizatriptan (MAXALT) 10 MG tablet Take 10 mg by mouth as needed.    Yes [provider]  clindamycin (  CLEOCIN) 150 MG capsule Take 150 mg by mouth as needed. 4 tablets prior to dental appointment    [provider]    Family History  Problem Relation Age of Onset  . Diabetes Father   . Diabetes Brother   . Colon cancer Maternal Uncle   . Ovarian cancer Maternal Grandfather   . Breast cancer Neg Hx      Social History   Tobacco Use  . Smoking status: Former Smoker    Last attempt to quit: 04/02/1999    Years since quitting: 17.8  . Smokeless tobacco: Never Used  Substance Use Topics  . Alcohol use: Yes    Comment: rare  . Drug use: No    Allergies as of 02/12/2017 - Review Complete 02/12/2017  Allergen Reaction Noted  .  Penicillins Rash 04/25/2015  . Pravastatin Other (See Comments) 10/08/2016  . Alendronate Nausea Only 04/25/2015  . Codeine Nausea Only 04/25/2015  . Oseltamivir Rash 04/25/2015  . Rofecoxib Swelling 04/25/2015    Review of Systems:    All systems reviewed and negative except where noted in HPI.   Physical Exam:  Vital signs in last 24 hours: Vitals reviewed   General:   Pleasant, cooperative in NAD Head:  Normocephalic and atraumatic. Eyes:   No icterus.   Conjunctiva pink. PERRLA. Ears:  Normal auditory acuity. Neck:  Supple; no masses or thyroidomegaly Lungs: Respirations even and unlabored. Lungs clear to auscultation bilaterally.   No wheezes, crackles, or rhonchi.  Heart:  Regular rate and rhythm;  Without murmur, clicks, rubs or gallops Abdomen:  Soft, nondistended, nontender. Normal bowel sounds. No appreciable masses or hepatomegaly.  No rebound or guarding.  Neurologic:  Alert and oriented x3;  grossly normal neurologically. Skin:  Intact without significant lesions or rashes. Cervical Nodes:  No significant cervical adenopathy. Psych:  Alert and cooperative. Normal affect.  LAB RESULTS: No results for input(s): WBC, HGB, HCT, PLT in the last 72 hours. BMET No results for input(s): NA, K, CL, CO2, GLUCOSE, BUN, CREATININE, CALCIUM in the last 72 hours. LFT No results for input(s): PROT, ALBUMIN, AST, ALT, ALKPHOS, BILITOT, BILIDIR, IBILI in the last 72 hours. PT/INR No results for input(s): LABPROT, INR in the last 72 hours.  July 2018 labs in care everywhere reviewed and showed no anemia and normal CMP  STUDIES: No results found. Positive FOBT   Impression / Plan:   Danielle Richardson is a 71 y.o. y/o female with positive FOBT with no alarm symptoms with EGD and colonoscopy in 2015 as mentioned in HPI  Due to the positive FOBT, colonoscopy is indicated at this time. However, for future purposes, as indicated in CDC recommendations and previous studies quoted  below FOBT testing can be suspended for upto 5 yrs after a negative colonoscopy in average risk patients. GIE April 2005 Finkelstein et. al "In asymptomatic average-risk patients who have had a negative colonoscopy within the last 5 years, the prevalence of adenomas is low and no patient was diagnosed with cancer. Although these findings support the CDC recommendations to suspend annual FOBT for up to 5 years after a negative total colon examination, suspending FOBT for up to 10 years may miss potentially curable lesions."  Repeat EGD will also be scheduled as her previous EGD biopsy showed columnar epithelium on GE junction biopsies and rare goblet cell indicative of Barrett's. Will evaluate for any salmon colored mucosa and repeat biopsies if needed. Pt. Was given the option of no further barretts surveillance  and she choses to repeat EGD for evaluation for Barretts and surveillance biopsies as needed.   I have discussed alternative options, risks & benefits,  which include, but are not limited to, bleeding, infection, perforation,respiratory complication & drug reaction.  The patient agrees with this plan & written consent will be obtained.     Thank you for involving me in the care of this patient.    Virgel Manifold, MD  02/12/2017, 10:01 AM

## 2017-02-18 ENCOUNTER — Encounter: Payer: Self-pay | Admitting: *Deleted

## 2017-02-25 ENCOUNTER — Encounter
Admission: RE | Disposition: A | Discharge status: Home / Self Care | Payer: Self-pay | Source: Ambulatory Visit | Attending: Gastroenterology

## 2017-02-25 ENCOUNTER — Encounter: Payer: Self-pay | Admitting: Anesthesiology

## 2017-02-25 ENCOUNTER — Ambulatory Visit: Payer: Medicare Other | Admitting: Anesthesiology

## 2017-02-25 ENCOUNTER — Ambulatory Visit
Admission: RE | Admit: 2017-02-25 | Discharge: 2017-02-25 | Disposition: A | Discharge status: Home / Self Care | Payer: Medicare Other | Source: Ambulatory Visit | Attending: Gastroenterology | Admitting: Gastroenterology

## 2017-02-25 DIAGNOSIS — J449 Chronic obstructive pulmonary disease, unspecified: Secondary | ICD-10-CM | POA: Insufficient documentation

## 2017-02-25 DIAGNOSIS — Z7982 Long term (current) use of aspirin: Secondary | ICD-10-CM | POA: Diagnosis not present

## 2017-02-25 DIAGNOSIS — Z8719 Personal history of other diseases of the digestive system: Secondary | ICD-10-CM | POA: Diagnosis not present

## 2017-02-25 DIAGNOSIS — Z888 Allergy status to other drugs, medicaments and biological substances status: Secondary | ICD-10-CM | POA: Insufficient documentation

## 2017-02-25 DIAGNOSIS — K3189 Other diseases of stomach and duodenum: Secondary | ICD-10-CM | POA: Diagnosis not present

## 2017-02-25 DIAGNOSIS — K219 Gastro-esophageal reflux disease without esophagitis: Secondary | ICD-10-CM | POA: Diagnosis not present

## 2017-02-25 DIAGNOSIS — Z86010 Personal history of colonic polyps: Secondary | ICD-10-CM | POA: Diagnosis not present

## 2017-02-25 DIAGNOSIS — I10 Essential (primary) hypertension: Secondary | ICD-10-CM | POA: Diagnosis not present

## 2017-02-25 DIAGNOSIS — Z87891 Personal history of nicotine dependence: Secondary | ICD-10-CM | POA: Diagnosis not present

## 2017-02-25 DIAGNOSIS — D122 Benign neoplasm of ascending colon: Secondary | ICD-10-CM | POA: Diagnosis not present

## 2017-02-25 DIAGNOSIS — Z880 Allergy status to penicillin: Secondary | ICD-10-CM | POA: Diagnosis not present

## 2017-02-25 DIAGNOSIS — K228 Other specified diseases of esophagus: Secondary | ICD-10-CM | POA: Diagnosis not present

## 2017-02-25 DIAGNOSIS — M1990 Unspecified osteoarthritis, unspecified site: Secondary | ICD-10-CM | POA: Diagnosis not present

## 2017-02-25 DIAGNOSIS — K635 Polyp of colon: Secondary | ICD-10-CM | POA: Diagnosis not present

## 2017-02-25 DIAGNOSIS — D125 Benign neoplasm of sigmoid colon: Secondary | ICD-10-CM

## 2017-02-25 DIAGNOSIS — K5730 Diverticulosis of large intestine without perforation or abscess without bleeding: Secondary | ICD-10-CM

## 2017-02-25 DIAGNOSIS — K921 Melena: Secondary | ICD-10-CM | POA: Diagnosis not present

## 2017-02-25 DIAGNOSIS — Z79899 Other long term (current) drug therapy: Secondary | ICD-10-CM | POA: Diagnosis not present

## 2017-02-25 DIAGNOSIS — I2510 Atherosclerotic heart disease of native coronary artery without angina pectoris: Secondary | ICD-10-CM | POA: Insufficient documentation

## 2017-02-25 DIAGNOSIS — E876 Hypokalemia: Secondary | ICD-10-CM | POA: Diagnosis not present

## 2017-02-25 DIAGNOSIS — E785 Hyperlipidemia, unspecified: Secondary | ICD-10-CM | POA: Diagnosis not present

## 2017-02-25 HISTORY — PX: COLONOSCOPY WITH PROPOFOL: SHX5780

## 2017-02-25 HISTORY — DX: Atherosclerotic heart disease of native coronary artery without angina pectoris: I25.10

## 2017-02-25 HISTORY — DX: Chronic obstructive pulmonary disease, unspecified: J44.9

## 2017-02-25 HISTORY — PX: ESOPHAGOGASTRODUODENOSCOPY (EGD) WITH PROPOFOL: SHX5813

## 2017-02-25 HISTORY — DX: Hyperlipidemia, unspecified: E78.5

## 2017-02-25 SURGERY — COLONOSCOPY WITH PROPOFOL
Anesthesia: General | Wound class: Clean Contaminated

## 2017-02-25 MED ORDER — LACTATED RINGERS IV SOLN
10.0000 mL/h | INTRAVENOUS | Status: DC
  Administered 2017-02-25: 10 mL/h via INTRAVENOUS

## 2017-02-25 MED ORDER — ONDANSETRON HCL 4 MG/2ML IJ SOLN: 4.0000 mg | Freq: Once | INTRAMUSCULAR | Status: DC | PRN

## 2017-02-25 MED ORDER — PROPOFOL 10 MG/ML IV BOLUS
INTRAVENOUS | Status: DC | PRN
  Administered 2017-02-25: 10 mg via INTRAVENOUS
  Administered 2017-02-25: 50 mg via INTRAVENOUS
  Administered 2017-02-25: 30 mg via INTRAVENOUS
  Administered 2017-02-25: 50 mg via INTRAVENOUS
  Administered 2017-02-25 (×2): 30 mg via INTRAVENOUS
  Administered 2017-02-25: 50 mg via INTRAVENOUS
  Administered 2017-02-25 (×2): 20 mg via INTRAVENOUS
  Administered 2017-02-25: 10 mg via INTRAVENOUS
  Administered 2017-02-25 (×2): 20 mg via INTRAVENOUS
  Administered 2017-02-25: 50 mg via INTRAVENOUS
  Administered 2017-02-25 (×2): 10 mg via INTRAVENOUS
  Administered 2017-02-25 (×2): 30 mg via INTRAVENOUS
  Administered 2017-02-25: 20 mg via INTRAVENOUS
  Administered 2017-02-25: 50 mg via INTRAVENOUS
  Administered 2017-02-25: 30 mg via INTRAVENOUS
  Administered 2017-02-25 (×2): 20 mg via INTRAVENOUS
  Administered 2017-02-25: 10 mg via INTRAVENOUS
  Administered 2017-02-25: 50 mg via INTRAVENOUS
  Administered 2017-02-25: 100 mg via INTRAVENOUS
  Administered 2017-02-25: 20 mg via INTRAVENOUS
  Administered 2017-02-25: 30 mg via INTRAVENOUS
  Administered 2017-02-25 (×2): 20 mg via INTRAVENOUS

## 2017-02-25 MED ORDER — ACETAMINOPHEN 160 MG/5ML PO SOLN: 325.0000 mg | ORAL | Status: DC | PRN

## 2017-02-25 MED ORDER — LIDOCAINE HCL (CARDIAC) 20 MG/ML IV SOLN
INTRAVENOUS | Status: DC | PRN
  Administered 2017-02-25: 40 mg via INTRAVENOUS

## 2017-02-25 MED ORDER — ACETAMINOPHEN 325 MG PO TABS: 325.0000 mg | ORAL_TABLET | ORAL | Status: DC | PRN

## 2017-02-25 MED ORDER — GLYCOPYRROLATE 0.2 MG/ML IJ SOLN
INTRAMUSCULAR | Status: DC | PRN
  Administered 2017-02-25: .1 mg via INTRAVENOUS

## 2017-02-25 SURGICAL SUPPLY — 35 items
BALLN DILATOR 10-12 8 (BALLOONS)
BALLN DILATOR 12-15 8 (BALLOONS)
BALLN DILATOR 15-18 8 (BALLOONS)
BALLN DILATOR CRE 0-12 8 (BALLOONS)
BALLN DILATOR ESOPH 8 10 CRE (MISCELLANEOUS) IMPLANT
BALLOON DILATOR 12-15 8 (BALLOONS) IMPLANT
BALLOON DILATOR 15-18 8 (BALLOONS) IMPLANT
BALLOON DILATOR CRE 0-12 8 (BALLOONS) IMPLANT
BLOCK BITE 60FR ADLT L/F GRN (MISCELLANEOUS) ×3 IMPLANT
CANISTER SUCT 1200ML W/VALVE (MISCELLANEOUS) ×3 IMPLANT
CLIP HMST 235XBRD CATH ROT (MISCELLANEOUS) IMPLANT
CLIP RESOLUTION 360 11X235 (MISCELLANEOUS)
FCP ESCP3.2XJMB 240X2.8X (MISCELLANEOUS)
FORCEPS BIOP RAD 4 LRG CAP 4 (CUTTING FORCEPS) ×3 IMPLANT
FORCEPS BIOP RJ4 240 W/NDL (MISCELLANEOUS)
FORCEPS ESCP3.2XJMB 240X2.8X (MISCELLANEOUS) IMPLANT
GOWN CVR UNV OPN BCK APRN NK (MISCELLANEOUS) ×2 IMPLANT
GOWN ISOL THUMB LOOP REG UNIV (MISCELLANEOUS) ×4
INJECTOR VARIJECT VIN23 (MISCELLANEOUS) ×1 IMPLANT
KIT DEFENDO VALVE AND CONN (KITS) IMPLANT
KIT ENDO PROCEDURE OLY (KITS) ×3 IMPLANT
MARKER SPOT ENDO TATTOO 5ML (MISCELLANEOUS) ×1 IMPLANT
PAD GROUND ADULT SPLIT (MISCELLANEOUS) IMPLANT
PROBE APC STR FIRE (PROBE) IMPLANT
RETRIEVER NET PLAT FOOD (MISCELLANEOUS) IMPLANT
RETRIEVER NET ROTH 2.5X230 LF (MISCELLANEOUS) IMPLANT
SNARE SHORT THROW 13M SML OVAL (MISCELLANEOUS) IMPLANT
SNARE SHORT THROW 30M LRG OVAL (MISCELLANEOUS) IMPLANT
SNARE SNG USE RND 15MM (INSTRUMENTS) IMPLANT
SPOT EX ENDOSCOPIC TATTOO (MISCELLANEOUS) ×2
SYR INFLATION 60ML (SYRINGE) IMPLANT
TRAP ETRAP POLY (MISCELLANEOUS) IMPLANT
VARIJECT INJECTOR VIN23 (MISCELLANEOUS) ×3
WATER STERILE IRR 250ML POUR (IV SOLUTION) ×3 IMPLANT
WIRE CRE 18-20MM 8CM F G (MISCELLANEOUS) IMPLANT

## 2017-02-25 NOTE — Transfer of Care (Signed)
Immediate Anesthesia Transfer of Care Note  Patient: Danielle Richardson  Procedure(s) Performed: COLONOSCOPY WITH PROPOFOL (N/A ) ESOPHAGOGASTRODUODENOSCOPY (EGD) WITH PROPOFOL (N/A )  Patient Location: PACU  Anesthesia Type: General  Level of Consciousness: awake, alert  and patient cooperative  Airway and Oxygen Therapy: Patient Spontanous Breathing and Patient connected to supplemental oxygen  Post-op Assessment: Post-op Vital signs reviewed, Patient's Cardiovascular Status Stable, Respiratory Function Stable, Patent Airway and No signs of Nausea or vomiting  Post-op Vital Signs: Reviewed and stable  Complications: No apparent anesthesia complications

## 2017-02-25 NOTE — H&P (Signed)
[name], MD [address], [place], [place], [place], [address] [address], [place], [place], [place], [address] Phone: [phone]  Fax: [phone]  Primary Care Physician:  [name], MD   Pre-Procedure History & Physical: HPI:  Danielle Richardson is a 71 y.o. female is here for an egd and colonoscopy.   Past Medical History:  Diagnosis Date  . Arthritis   . Cataract   . Chronic obstructive pulmonary disease ([place]) 09/11/2013  . Coronary artery disease involving native coronary artery of native heart 09/23/2016   Overview:  Minimal by cath 2017  . GERD (gastroesophageal reflux disease)   . HLD (hyperlipidemia) 09/11/2013  . Joint pain   . Left bundle branch block   . Migraine   . Pelvic pain in female   . SUI (stress urinary incontinence, female)   . Vertigo   . Vertigo     Past Surgical History:  Procedure Laterality Date  . AUGMENTATION MAMMAPLASTY Bilateral    Pt had implants and then removed  . bladder tack  1999  . CARDIAC CATHETERIZATION N/A 09/12/2015   Procedure: Left Heart Cath and Coronary Angiography;  Surgeon: Corey Skains, MD;  Location: [facility];  Service: Cardiovascular;  Laterality: N/A;  . CATARACT EXTRACTION W/ INTRAOCULAR LENS  IMPLANT, BILATERAL  2018  . CHOLECYSTECTOMY  1994  . EYE SURGERY    . HYSTEROSCOPY W/D&C N/A 01/08/2016   Procedure: DILATATION AND CURETTAGE /HYSTEROSCOPY;  Surgeon: Brayton Mars, MD;  Location: [facility];  Service: Gynecology;  Laterality: N/A;  . JOINT REPLACEMENT  2010   right shoulder  . NECK SURGERY  2010   Cervical Discectomy with fusion & plating  . SHOULDER SURGERY Right 2010   Total Shoulder Replacement, [facility]  . TOE SURGERY      Prior to Admission medications   Medication Sig Start Date End Date Taking? Authorizing Provider  aspirin EC 81 MG tablet Take 81 mg by mouth daily.     [provider]  Calcium-Vitamin D-Vitamin K 500-100-40 MG-UNT-MCG  CHEW Chew 1 Dose by mouth daily.    [provider]  clindamycin (CLEOCIN) 150 MG capsule Take 150 mg by mouth as needed. 4 tablets prior to dental appointment    [provider]  conjugated estrogens (PREMARIN) vaginal cream Place 3.61 Applicatorfuls vaginally 2 (two) times a week. 01/20/17   Defrancesco, [name], MD  fluticasone (FLONASE) 50 MCG/ACT nasal spray Place 2 sprays into the nose at bedtime.  03/28/15   [provider]  meclizine (ANTIVERT) 25 MG tablet Take 25 mg by mouth 3 (three) times daily as needed.  09/14/13   [provider]  meloxicam (MOBIC) 7.5 MG tablet Take 7.5 mg by mouth daily. May take twice a day if needed 03/28/15   [provider]  Multiple Vitamins-Minerals (MULTIVITAMIN GUMMIES ADULT PO) Take 1 Dose by mouth daily.    [provider]  oxybutynin (DITROPAN) 5 MG tablet Take 1 tablet (5 mg total) by mouth at bedtime. 01/16/17   Defrancesco, [name], MD  pantoprazole (PROTONIX) 40 MG tablet Take 40 mg by mouth 2 (two) times daily.  03/28/15   [provider]  polyethylene glycol powder (GLYCOLAX/MIRALAX) powder Take by mouth as needed.  09/14/13   [provider]  rizatriptan (MAXALT) 10 MG tablet Take 10 mg by mouth as needed.     [provider]    Allergies as of 02/12/2017 - Review Complete 02/12/2017  Allergen Reaction Noted  . Penicillins Rash 04/25/2015  . Pravastatin Other (See Comments) 10/08/2016  . Alendronate Nausea Only 04/25/2015  . Codeine Nausea Only 04/25/2015  . Oseltamivir Rash 04/25/2015  . Rofecoxib Swelling 04/25/2015    Family History  Problem Relation Age of Onset  . Diabetes Father   . Diabetes Brother   . Colon cancer Maternal Uncle   . Ovarian cancer Maternal Grandfather   . Breast cancer Neg Hx     Social History   Socioeconomic History  . Marital status: Married    Spouse name: Not on file  . Number of children: Not on file  . Years of  education: Not on file  . Highest education level: Not on file  Social Needs  . Financial resource strain: Not on file  . Food insecurity - worry: Not on file  . Food insecurity - inability: Not on file  . Transportation needs - medical: Not on file  . Transportation needs - non-medical: Not on file  Occupational History  . Not on file  Tobacco Use  . Smoking status: Former Smoker    Last attempt to quit: 04/02/1999    Years since quitting: 17.9  . Smokeless tobacco: Never Used  Substance and Sexual Activity  . Alcohol use: Yes    Comment: rare - 1-2x/yr  . Drug use: No  . Sexual activity: No    Birth control/protection: Post-menopausal  Other Topics Concern  . Not on file  Social History Narrative  . Not on file    Review of Systems: See HPI, otherwise negative ROS  Physical Exam: Ht 5\' 5"  (1.651 m)   Wt 80.8 kg (178 lb 3.2 oz)   BMI 29.65 kg/m  General:   Alert,  pleasant and cooperative in NAD Head:  Normocephalic and atraumatic. Neck:  Supple; no masses or thyromegaly. Lungs:  Clear throughout to auscultation, normal respiratory effort.    Heart:  +S1, +S2, Regular rate and rhythm, No edema. Abdomen:  Soft, nontender and nondistended. Normal bowel sounds, without guarding, and without rebound.   Neurologic:  Alert and  oriented x4;  grossly normal neurologically.  Impression/Plan: Danielle Richardson is here for an egd and colonoscopy to be performed for surveillance due to prior history of colon polyps  And fobt positive and possible Barretts  Risks, benefits, limitations, and alternatives regarding  egd and colonoscopy have been reviewed with the patient.  Questions have been answered.  All parties agreeable.   Virgel Manifold, MD  02/25/2017, 7:39 AM

## 2017-02-25 NOTE — Discharge Instructions (Signed)
General Anesthesia, Adult, Care After °These instructions provide you with information about caring for yourself after your procedure. Your health care provider may also give you more specific instructions. Your treatment has been planned according to current medical practices, but problems sometimes occur. Call your health care provider if you have any problems or questions after your procedure. °What can I expect after the procedure? °After the procedure, it is common to have: °· Vomiting. °· A sore throat. °· Mental slowness. ° °It is common to feel: °· Nauseous. °· Cold or shivery. °· Sleepy. °· Tired. °· Sore or achy, even in parts of your body where you did not have surgery. ° °Follow these instructions at home: °For at least 24 hours after the procedure: °· Do not: °? Participate in activities where you could fall or become injured. °? Drive. °? Use heavy machinery. °? Drink alcohol. °? Take sleeping pills or medicines that cause drowsiness. °? Make important decisions or sign legal documents. °? Take care of children on your own. °· Rest. °Eating and drinking °· If you vomit, drink water, juice, or soup when you can drink without vomiting. °· Drink enough fluid to keep your urine clear or pale yellow. °· Make sure you have little or no nausea before eating solid foods. °· Follow the diet recommended by your health care provider. °General instructions °· Have a responsible adult stay with you until you are awake and alert. °· Return to your normal activities as told by your health care provider. Ask your health care provider what activities are safe for you. °· Take over-the-counter and prescription medicines only as told by your health care provider. °· If you smoke, do not smoke without supervision. °· Keep all follow-up visits as told by your health care provider. This is important. °Contact a health care provider if: °· You continue to have nausea or vomiting at home, and medicines are not helpful. °· You  cannot drink fluids or start eating again. °· You cannot urinate after 8-12 hours. °· You develop a skin rash. °· You have fever. °· You have increasing redness at the site of your procedure. °Get help right away if: °· You have difficulty breathing. °· You have chest pain. °· You have unexpected bleeding. °· You feel that you are having a life-threatening or urgent problem. °This information is not intended to replace advice given to you by your health care provider. Make sure you discuss any questions you have with your health care provider. °Document Released: 06/24/2000 Document Revised: 08/21/2015 Document Reviewed: 03/02/2015 °Elsevier Interactive Patient Education © 2018 [employer] ° °

## 2017-02-25 NOTE — Anesthesia Postprocedure Evaluation (Signed)
Anesthesia Post Note  Patient: Danielle Richardson  Procedure(s) Performed: COLONOSCOPY WITH PROPOFOL (N/A ) ESOPHAGOGASTRODUODENOSCOPY (EGD) WITH PROPOFOL (N/A )  Patient location during evaluation: PACU Anesthesia Type: General Level of consciousness: awake Pain management: pain level controlled Vital Signs Assessment: post-procedure vital signs reviewed and stable Respiratory status: respiratory function stable Cardiovascular status: stable Postop Assessment: no signs of nausea or vomiting Anesthetic complications: no    Veda Canning

## 2017-02-25 NOTE — Op Note (Addendum)
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 Providers:            Lennette Bihari. Bonna Gains MD, MD Referring MD:         Leonie Douglas. Doy Hutching, MD (Referring MD) Medicines:            Monitored Anesthesia Care Complications:        No immediate complications. Procedure:            Pre-Anesthesia Assessment:                       - Prior to the procedure, a History and Physical was                        performed, and patient medications, allergies and                        sensitivities were reviewed. The patient's tolerance of                        previous anesthesia was reviewed.                       - The risks and benefits of the procedure and the                        sedation options and risks were discussed with the                        patient. All questions were answered and informed                        consent was obtained.                       - Patient identification and proposed procedure were                        verified prior to the procedure by the physician, the                        nurse, the anesthesiologist, the anesthetist and the                        technician. The procedure was verified in the procedure                        room.                       - ASA Grade Assessment: III - A patient with severe  systemic disease.                       After obtaining informed consent, the endoscope was                        passed under direct vision. Throughout the procedure,                        the patient's blood pressure, pulse, and  oxygen                        saturations were monitored continuously. The Olympus                        [id number]-HQ190 Endoscope (S#. [id number]) was introduced                        through the mouth, and advanced to the second part of                        duodenum. The upper GI endoscopy was accomplished with                        ease. The patient tolerated the procedure well. Findings:      The Z-line was irregular and was found 40 cm from the incisors. Mucosa       was biopsied with a cold forceps for histology in 4 quadrants at the       gastroesophageal junction.      The examined esophagus was normal otherwise.      Patchy mildly erythematous mucosa without bleeding was found in the       gastric antrum. Biopsies were taken with a cold forceps for histology.      The duodenal bulb, second portion of the duodenum and examined duodenum       were normal. Impression:           - Normal esophagus.                       - Erythematous mucosa in the antrum. Biopsied.                       - Normal duodenal bulb, second portion of the duodenum                        and examined duodenum.                       - Z-line irregular, 40 cm from the incisors. Biopsied.                       - Nausea vomiting likely related to her polypharmacy,                        and/or migraines. Would recommend further workup for                        this by PMD including workup for her hypokalemia. See  colonoscopy report Recommendation:       - Await pathology results.                       - Discharge patient to home (with escort).                       - Advance diet as tolerated.                       - Continue present medications.                       - Patient has a contact number available for                        emergencies. The signs and symptoms of potential                        delayed complications were discussed with the patient.                        Return  to normal activities tomorrow. Written discharge                        instructions were provided to the patient.                       - Discharge patient to home (with escort).                       - The findings and recommendations were discussed with                        the patient.                       - The findings and recommendations were discussed with                        the patient's family.                       - Proceed to colonoscopy today and see colonoscopy                        report Procedure Code(s):    --- Professional ---                       [phone], Esophagogastroduodenoscopy, flexible, transoral;                        with biopsy, single or multiple Diagnosis Code(s):    --- Professional ---                       K22.8, Other specified diseases of esophagus                       K31.89, Other diseases of stomach and duodenum CPT copyright 2016 American Medical Association. All rights reserved. The codes documented in this report are preliminary and upon coder review may  be revised to meet current compliance requirements.  [name], MD  Dalores Weger B. Bonna Gains MD, MD 02/25/2017 8:48:55 AM This report has been signed electronically. Number of Addenda: 1 Note Initiated On: 02/25/2017 8:23 AM      [facility] Addendum Number: 1   Addendum Date: 03/03/2017 3:07:43 PM      Please note that the following Line under Impressions was entered in       Error: "Nausea vomiting likely related to her polypharmacy, and/or       migraines. Would recommend further workup for      this by PMD including workup for her hypokalemia."      Patient does NOT have Nausea/vomiting or hypokalemia.  [name], MD Margretta Sidle B. Bonna Gains MD, MD 03/03/2017 3:09:17 PM This report has been signed electronically.

## 2017-02-25 NOTE — Op Note (Addendum)
[facility] Gastroenterology Patient Name: Danielle Richardson Procedure Date: 02/25/2017 8:49 AM MRN: [id number] Account #: [id number] Date of Birth: Feb 25, 1946 Admit Type: Outpatient Age: 71 Room: [facility] OR ROOM 01 Gender: Female Note Status: Addendum Procedure:            Colonoscopy Indications:          High risk colon cancer surveillance: Personal history                        of colonic polyps, Incidental - Heme positive stool Providers:            Jearldine Cassady B. Bonna Gains MD, MD Referring MD:         Leonie Douglas. Doy Hutching, MD (Referring MD) Medicines:            Monitored Anesthesia Care Complications:        No immediate complications. Procedure:            Pre-Anesthesia Assessment:                       - ASA Grade Assessment: III - A patient with severe                        systemic disease.                       - Prior to the procedure, a History and Physical was                        performed, and patient medications, allergies and                        sensitivities were reviewed. The patient's tolerance of                        previous anesthesia was reviewed.                       - The risks and benefits of the procedure and the                        sedation options and risks were discussed with the                        patient. All questions were answered and informed                        consent was obtained.                       - Patient identification and proposed procedure were                        verified prior to the procedure by the physician, the                        nurse, the anesthesiologist, the anesthetist and the                        technician. The procedure was verified in the procedure  room.                       After obtaining informed consent, the colonoscope was                        passed under direct vision. Throughout the procedure,                        the patient's blood pressure,  pulse, and oxygen                        saturations were monitored continuously. The Olympus                        Colonoscope 190 [id number]) was introduced through the                        anus and advanced to the the cecum, identified by                        appendiceal orifice and ileocecal valve. The                        colonoscopy was performed with ease. The patient                        tolerated the procedure well. The quality of the bowel                        preparation was good. Findings:      The rectum, sigmoid colon, descending colon, transverse colon, ascending       colon and cecum appeared normal.      A 6 mm polyp was found in the ascending colon. The polyp was flat. The       polyp was removed with a piecemeal technique using a cold biopsy       forceps. Due to the polyp being behind a fold, visibility of the area       was difficult.. There is possibility of more polyp tissue being left at       the site. Area was tattooed with an injection of Spot (carbon black).       The polyp was behind a fold and there could be more polyp tissue left at       the site due to difficult visibility behind the fold. The tatoo was       placed adjacent to the polypectomy site.      A 4 mm polyp was found in the ascending colon. The polyp was sessile.       The polyp was removed with a cold biopsy forceps. Resection and       retrieval were complete.      A 3 mm polyp was found in the sigmoid colon. The polyp was sessile. The       polyp was removed with a cold biopsy forceps. Resection and retrieval       were complete.      Multiple small and large-mouthed diverticula were found in the entire       colon. There was no evidence of diverticular bleeding.      The exam was otherwise without abnormality.  The retroflexed view of the distal rectum and anal verge was normal and       showed no anal or rectal abnormalities. Impression:           - The rectum, sigmoid  colon, descending colon,                        transverse colon, ascending colon and cecum are normal.                       - One 6 mm polyp in the ascending colon, removed                        piecemeal using a cold biopsy forceps. Resected and                        retrieved. Tattooed.                       - One 4 mm polyp in the ascending colon, removed with a                        cold biopsy forceps. Resected and retrieved.                       - One 3 mm polyp in the sigmoid colon, removed with a                        cold biopsy forceps. Resected and retrieved.                       - Diverticulosis in the entire examined colon. There                        was no evidence of diverticular bleeding.                       - The examination was otherwise normal.                       - The distal rectum and anal verge are normal on                        retroflexion view. Recommendation:       - Follow up with PMD in regard to workup for                        hypokalemia. Repeat labs ordered for today and pt. and                        family asked to have this done today. She is on                        potassium replacement for now but further refills and                        workup for hypokalemia to be done by PMD office. Please  note that before her referral PMD had obtained labs and                        she was noted to be hypokalemic on Nov 12th. We                        repeated labs on Nov 21st and she was still hypokalemic                        at 3.3 and she was started on potassium daily since                        then for 2 weeks. She does not have any diarrhea.                       - Follow up with PMD in regard to polypharmacy and                        discuss decreasing medicines if possible                       - Discharge patient to home (with escort).                       - Advance diet as tolerated.                        - Continue present medications.                       - Await pathology results.                       - Repeat colonoscopy date to be determined after                        pending pathology results are reviewed for surveillance.                       - The findings and recommendations were discussed with                        the patient.                       - The findings and recommendations were discussed with                        the patient's family.                       - Return to primary care physician as previously                        scheduled. Procedure Code(s):    --- Professional ---                       [phone], Colonoscopy, flexible; with directed submucosal                        injection(s), any substance  [id number], Colonoscopy, flexible; with biopsy, single or                        multiple Diagnosis Code(s):    --- Professional ---                       Z86.010, Personal history of colonic polyps                       D12.2, Benign neoplasm of ascending colon                       D12.5, Benign neoplasm of sigmoid colon                       K57.30, Diverticulosis of large intestine without                        perforation or abscess without bleeding CPT copyright 2016 American Medical Association. All rights reserved. The codes documented in this report are preliminary and upon coder review may  be revised to meet current compliance requirements.  [name], MD Margretta Sidle B. Bonna Gains MD, MD 02/25/2017 9:48:20 AM This report has been signed electronically. Number of Addenda: 1 Note Initiated On: 02/25/2017 8:49 AM Scope Withdrawal Time: 0 hours 36 minutes 57 seconds  Total Procedure Duration: 0 hours 43 minutes 52 seconds  Estimated Blood Loss: Estimated blood loss: none.      [facility] Addendum Number: 1   Addendum Date: 03/03/2017 3:09:38 PM      Please note that the following Lines under  Recommendations was entered       in Error:      "Follow up with PMD in regard to workup for hypokalemia. Repeat labs       ordered for today and pt. and family      asked to have this done today. She is on potassium replacement for now       but further refills and workup for      hypokalemia to be done by PMD office. Please note that before her       referral PMD had obtained labs and she      was noted to be hypokalemic on Nov 12th. We repeated labs on Nov 21st       and she was still hypokalemic at 3.3      and she was started on potassium daily since then for 2 weeks. She does       not have any diarrhea.      - Follow up with PMD in regard to polypharmacy and discuss decreasing       medicines if possible"      Patient does NOT have Nausea/vomiting or hypokalemia.  [name], MD Margretta Sidle B. Taija Mathias MD, MD 03/03/2017 3:11:03 PM This report has been signed electronically.

## 2017-02-25 NOTE — Anesthesia Procedure Notes (Signed)
Procedure Name: MAC Date/Time: 02/25/2017 8:30 AM Performed by: Janna Arch, CRNA Pre-anesthesia Checklist: Patient identified, Emergency Drugs available, Suction available and Patient being monitored Patient Re-evaluated:Patient Re-evaluated prior to induction Oxygen Delivery Method: Nasal cannula

## 2017-02-25 NOTE — Anesthesia Preprocedure Evaluation (Signed)
Anesthesia Evaluation  Patient identified by MRN, date of birth, ID band  Reviewed: Allergy & Precautions, NPO status   Airway Mallampati: II  TM Distance: >3 FB     Dental   Pulmonary COPD, former smoker,    breath sounds clear to auscultation       Cardiovascular hypertension, + CAD   Rhythm:Regular Rate:Normal  Cath 2017: Prox LAD to Mid LAD lesion, 20% stenosed LM lesion, 20% stenosed EF 50%   Neuro/Psych  Headaches,    GI/Hepatic GERD  ,  Endo/Other    Renal/GU      Musculoskeletal  (+) Arthritis ,   Abdominal   Peds  Hematology   Anesthesia Other Findings   Reproductive/Obstetrics                            Anesthesia Physical Anesthesia Plan  ASA: III  Anesthesia Plan: General   Post-op Pain Management:    Induction: Intravenous  PONV Risk Score and Plan:   Airway Management Planned: Nasal Cannula  Additional Equipment:   Intra-op Plan:   Post-operative Plan:   Informed Consent: I have reviewed the patients History and Physical, chart, labs and discussed the procedure including the risks, benefits and alternatives for the proposed anesthesia with the patient or authorized representative who has indicated his/her understanding and acceptance.     Plan Discussed with: CRNA  Anesthesia Plan Comments:         Anesthesia Quick Evaluation

## 2017-02-26 ENCOUNTER — Encounter: Payer: Self-pay | Admitting: Gastroenterology

## 2017-02-28 ENCOUNTER — Encounter: Payer: Self-pay | Admitting: Gastroenterology

## 2017-03-03 ENCOUNTER — Encounter: Payer: Self-pay | Admitting: Gastroenterology

## 2017-10-22 ENCOUNTER — Other Ambulatory Visit: Payer: Self-pay | Admitting: Internal Medicine

## 2017-10-22 DIAGNOSIS — Z1231 Encounter for screening mammogram for malignant neoplasm of breast: Secondary | ICD-10-CM

## 2017-11-06 ENCOUNTER — Ambulatory Visit
Admission: RE | Admit: 2017-11-06 | Discharge: 2017-11-06 | Disposition: A | Discharge status: Home / Self Care | Payer: Medicare Other | Source: Ambulatory Visit | Attending: Internal Medicine | Admitting: Internal Medicine

## 2017-11-06 DIAGNOSIS — Z1231 Encounter for screening mammogram for malignant neoplasm of breast: Secondary | ICD-10-CM | POA: Insufficient documentation

## 2017-11-12 NOTE — Progress Notes (Signed)
11/13/2017 3:38 PM   Danielle Richardson [birth year] [id number]  Referring provider: [name], MD [facility] [place], [place] [address]  Chief Complaint  Patient presents with  . Pelvic Pain    HPI: Patient is a 72 -year-old Caucasian female who presents today as a referral from Dr. [name] for perineal pain and incomplete bladder emptying with her husband, Butch.    She states that she feels like something is jabbing her vaginal regardless if she is sitting, standing or laying.  It is pulsatile in nature.  She has not noted anything that helps the pain or makes it worse.  7/10 pain.  This has been occurring since 2017.    She is also having the feeling of incomplete emptying  Patient was found to have microscopic hematuria on several occasions with 0-3 RBC's/hpf.  She does not have a prior history of recurrent urinary tract infections, nephrolithiasis, trauma to the genitourinary tract or malignancies of the genitourinary tract.  She states her mother and brother had stones.  She has not had any family history of malignancies of the genitourinary tract or hematuria.   Today, she is having symptoms of urgency, dysuria, nocturia, incontinence, hesitancy, straining to urinate or a weak urinary stream.  Her UA today is negative.    Patient denies any gross hematuria, dysuria or suprapubic/flank pain.  Patient denies any fevers, chills, nausea or vomiting.    She takes oxybutynin 2.5 mg every other night.  She is also applying vaginal estrogen cream.    She is a former smoker, with a 1 ppd history.  Quit 18 years ago.  They are/are not exposed to secondhand smoke.  They have/have not worked with Sports administrator, trichloroethylene, etc.      PMH: Past Medical History:  Diagnosis Date  . Arthritis   . Cataract   . Chronic obstructive pulmonary disease ([place]) 09/11/2013  . Coronary artery disease involving native coronary artery of  native heart 09/23/2016   Overview:  Minimal by cath 2017  . GERD (gastroesophageal reflux disease)   . HLD (hyperlipidemia) 09/11/2013  . Joint pain   . Left bundle branch block   . Migraine   . Pelvic pain in female   . SUI (stress urinary incontinence, female)   . Vertigo   . Vertigo     Surgical History: Past Surgical History:  Procedure Laterality Date  . AUGMENTATION MAMMAPLASTY Bilateral    Pt had implants and then removed  . bladder tack  1999  . CARDIAC CATHETERIZATION N/A 09/12/2015   Procedure: Left Heart Cath and Coronary Angiography;  Surgeon: Corey Skains, MD;  Location: [facility];  Service: Cardiovascular;  Laterality: N/A;  . CATARACT EXTRACTION W/ INTRAOCULAR LENS  IMPLANT, BILATERAL  2018  . CHOLECYSTECTOMY  1994  . COLONOSCOPY WITH PROPOFOL N/A 02/25/2017   Procedure: COLONOSCOPY WITH PROPOFOL;  Surgeon: Virgel Manifold, MD;  Location: [place];  Service: Endoscopy;  Laterality: N/A;  . ESOPHAGOGASTRODUODENOSCOPY (EGD) WITH PROPOFOL N/A 02/25/2017   Procedure: ESOPHAGOGASTRODUODENOSCOPY (EGD) WITH PROPOFOL;  Surgeon: Virgel Manifold, MD;  Location: [place];  Service: Endoscopy;  Laterality: N/A;  . EYE SURGERY    . HYSTEROSCOPY W/D&C N/A 01/08/2016   Procedure: DILATATION AND CURETTAGE /HYSTEROSCOPY;  Surgeon: Brayton Mars, MD;  Location: [facility];  Service: Gynecology;  Laterality: N/A;  . JOINT REPLACEMENT  2010   right shoulder  . NECK SURGERY  2010  Cervical Discectomy with fusion & plating  . SHOULDER SURGERY Right 2010   Total Shoulder Replacement, [facility]  . TOE SURGERY      Home Medications:  Allergies as of 11/13/2017      Reactions   Penicillins Rash   Pravastatin Other (See Comments)   Body aches   Alendronate Nausea Only   Codeine Nausea Only   Oseltamivir Rash   Rofecoxib Swelling   Swelling of feet      Medication List        Accurate as of 11/13/17  3:38 PM. Always use  your most recent med list.          aspirin EC 81 MG tablet Take 81 mg by mouth daily.   Calcium-Vitamin D-Vitamin K 500-100-40 MG-UNT-MCG Chew Chew 1 Dose by mouth daily.   clindamycin 150 MG capsule Commonly known as:  CLEOCIN Take 150 mg by mouth as needed. 4 tablets prior to dental appointment   conjugated estrogens vaginal cream Commonly known as:  PREMARIN Place 5.95 Applicatorfuls vaginally 2 (two) times a week.   fluticasone 50 MCG/ACT nasal spray Commonly known as:  FLONASE Place 2 sprays into the nose at bedtime.   hydrocortisone 2.5 % cream Apply topically.   meclizine 25 MG tablet Commonly known as:  ANTIVERT Take 25 mg by mouth 3 (three) times daily as needed.   meloxicam 7.5 MG tablet Commonly known as:  MOBIC Take 7.5 mg by mouth daily. May take twice a day if needed   metoprolol succinate 50 MG 24 hr tablet Commonly known as:  TOPROL-XL   mirabegron ER 25 MG Tb24 tablet Commonly known as:  MYRBETRIQ Take 1 tablet (25 mg total) by mouth daily.   MULTIVITAMIN GUMMIES ADULT PO Take 1 Dose by mouth daily.   oxybutynin 5 MG tablet Commonly known as:  DITROPAN Take 1 tablet (5 mg total) by mouth at bedtime.   oxyCODONE 5 MG immediate release tablet Commonly known as:  Oxy IR/ROXICODONE   pantoprazole 40 MG tablet Commonly known as:  PROTONIX Take 40 mg by mouth 2 (two) times daily.   polyethylene glycol powder powder Commonly known as:  GLYCOLAX/MIRALAX Take by mouth as needed.   ranitidine 300 MG tablet Commonly known as:  ZANTAC   rizatriptan 10 MG tablet Commonly known as:  MAXALT Take 10 mg by mouth as needed.   rosuvastatin 5 MG tablet Commonly known as:  CRESTOR       Allergies:  Allergies  Allergen Reactions  . Penicillins Rash  . Pravastatin Other (See Comments)    Body aches  . Alendronate Nausea Only  . Codeine Nausea Only  . Oseltamivir Rash  . Rofecoxib Swelling    Swelling of feet    Family History: Family  History  Problem Relation Age of Onset  . Diabetes Father   . Diabetes Brother   . Colon cancer Maternal Uncle   . Ovarian cancer Maternal Grandfather   . Breast cancer Neg Hx     Social History:  reports that she quit smoking about 18 years ago. She has never used smokeless tobacco. She reports that she drinks alcohol. She reports that she does not use drugs.  ROS: UROLOGY Frequent Urination?: No Hard to postpone urination?: Yes Burning/pain with urination?: Yes Get up at night to urinate?: Yes Leakage of urine?: Yes Urine stream starts and stops?: No Trouble starting stream?: Yes Do you have to strain to urinate?: Yes Blood in urine?: No Urinary tract infection?:  No Sexually transmitted disease?: No Injury to kidneys or bladder?: No Painful intercourse?: Yes Weak stream?: Yes Currently pregnant?: No Vaginal bleeding?: No Last menstrual period?: Hysterectomy  Gastrointestinal Nausea?: No Vomiting?: No Indigestion/heartburn?: Yes Diarrhea?: No Constipation?: Yes  Constitutional Fever: No Night sweats?: No Weight loss?: No Fatigue?: No  Skin Skin rash/lesions?: Yes Itching?: Yes  Eyes Blurred vision?: No Double vision?: No  Ears/Nose/Throat Sore throat?: No Sinus problems?: No  Hematologic/Lymphatic Swollen glands?: No Easy bruising?: No  Cardiovascular Leg swelling?: Yes Chest pain?: No  Respiratory Cough?: No Shortness of breath?: No  Endocrine Excessive thirst?: No  Musculoskeletal Back pain?: Yes Joint pain?: Yes  Neurological Headaches?: Yes Dizziness?: Yes  Psychologic Depression?: No Anxiety?: No  Physical Exam: BP 139/75 (BP Location: Left Arm, Patient Position: Sitting, Cuff Size: Large)   Pulse 73   Ht 5\' 6"  (1.676 m)   Wt 183 lb 1.6 oz (83.1 kg)   BMI 29.55 kg/m   Constitutional:  Well nourished. Alert and oriented, No acute distress. HEENT: [place] AT, moist mucus membranes.  Trachea midline, no masses. Cardiovascular:  No clubbing, cyanosis, or edema. Respiratory: Normal respiratory effort, no increased work of breathing. GI: Abdomen is soft, non tender, non distended, no abdominal masses. Liver and spleen not palpable.  No hernias appreciated.  Stool sample for occult testing is not indicated.   GU: No CVA tenderness.  No bladder fullness or masses.  Normal external genitalia, normal pubic hair distribution, no lesions.  Normal urethral meatus, no lesions, no prolapse, no discharge.   No urethral masses, tenderness and/or tenderness. No bladder fullness, tenderness or masses. Normal vagina mucosa, good estrogen effect, no discharge, no lesions, good pelvic support, no cystocele or rectocele noted.  No cervical motion tenderness.  Uterus is freely mobile and non-fixed.  No adnexal/parametria masses or tenderness noted.  Anus and perineum are without rashes or lesions.   Tender to palpation on the left pelvic brim.  Skin: No rashes, bruises or suspicious lesions. Lymph: No cervical or inguinal adenopathy. Neurologic: Grossly intact, no focal deficits, moving all 4 extremities. Psychiatric: Normal mood and affect.  Laboratory Data: Lab Results  Component Value Date   WBC 9.3 01/02/2016   HGB 14.5 01/02/2016   HCT 42.6 01/02/2016   MCV 91.8 01/02/2016   PLT 242 01/02/2016    Lab Results  Component Value Date   CREATININE 0.61 04/12/2008    No results found for: PSA  No results found for: TESTOSTERONE  No results found for: HGBA1C  No results found for: TSH  No results found for: CHOL, HDL, CHOLHDL, VLDL, LDLCALC  No results found for: AST No results found for: ALT No components found for: ALKALINEPHOPHATASE No components found for: BILIRUBINTOTAL  No results found for: ESTRADIOL   Urinalysis No results found for: COLORURINE, APPEARANCEUR, LABSPEC, PHURINE, GLUCOSEU, HGBUR, BILIRUBINUR, KETONESUR, PROTEINUR, UROBILINOGEN, NITRITE, LEUKOCYTESUR  Pertinent Imaging: Results for [name],  Doniesha S "MARIE" (MRN [id number]) as of 11/13/2017 15:41  Ref. Range 11/13/2017 15:28  Scan Result Unknown 78mL    Assessment & Plan:    1. Microscopic hematuria Explained to the patient that there are a number of causes that can be associated with blood in the urine, such as stones,  UTI's, damage to the urinary tract and/or cancer. At this time, I felt that the patient warranted further urologic evaluation.   The AUA guidelines state that a CT urogram is the preferred imaging study to evaluate hematuria. I explained to the patient that a contrast  material will be injected into a vein and that in rare instances, an allergic reaction can result and may even life threatening   The patient denies any allergies to contrast, iodine and/or seafood and is not taking metformin. Following the imaging study,  I've recommended a cystoscopy. I described how this is performed, typically in an office setting with a flexible cystoscope. We described the risks, benefits, and possible side effects, the most common of which is a minor amount of blood in the urine and/or burning which usually resolves in 24 to 48 hours.   The patient had the opportunity to ask questions which were answered. Based upon this discussion, the patient is willing to proceed. Therefore, I've ordered: a CT Urogram and cystoscopy.  The patient will return following all of the above for discussion of the results.  UA  Urine culture  BUN + creatinine    2. Pelvic pain If no etiology for her pain is found during her hematuria work up - will refer to PT   Return for CT Urogram report and cystoscopy.  These notes generated with voice recognition software. I apologize for typographical errors.  Zara Council, PA-C  [facility] [address] [place]  [place], [place] [address] [phone]

## 2017-11-13 ENCOUNTER — Encounter: Payer: Self-pay | Admitting: Urology

## 2017-11-13 ENCOUNTER — Ambulatory Visit (INDEPENDENT_AMBULATORY_CARE_PROVIDER_SITE_OTHER): Payer: Medicare Other | Admitting: Urology

## 2017-11-13 VITALS — BP 139/75 | HR 73 | Ht 66.0 in | Wt 183.1 lb

## 2017-11-13 DIAGNOSIS — R3129 Other microscopic hematuria: Secondary | ICD-10-CM

## 2017-11-13 LAB — URINALYSIS, COMPLETE
Bilirubin, UA: NEGATIVE
Glucose, UA: NEGATIVE
Ketones, UA: NEGATIVE
Leukocytes, UA: NEGATIVE
Nitrite, UA: NEGATIVE
PH UA: 5 (ref 5.0–7.5)
Protein, UA: NEGATIVE
RBC UA: NEGATIVE
Specific Gravity, UA: >1.03 — ABNORMAL HIGH (ref 1.005–1.030)
UUROB: 0.2 mg/dL (ref 0.2–1.0)

## 2017-11-13 LAB — MICROSCOPIC EXAMINATION: RBC, UA: NONE SEEN /hpf (ref 0–2)

## 2017-11-13 LAB — BLADDER SCAN AMB NON-IMAGING

## 2017-11-13 MED ORDER — MIRABEGRON ER 25 MG PO TB24: 25.0000 mg | ORAL_TABLET | Freq: Every day | ORAL | 0 refills | Status: DC

## 2017-11-16 LAB — CULTURE, URINE COMPREHENSIVE

## 2017-12-03 ENCOUNTER — Ambulatory Visit
Admission: RE | Admit: 2017-12-03 | Discharge: 2017-12-03 | Disposition: A | Discharge status: Home / Self Care | Payer: Medicare Other | Source: Ambulatory Visit | Attending: Urology | Admitting: Urology

## 2017-12-03 DIAGNOSIS — K5730 Diverticulosis of large intestine without perforation or abscess without bleeding: Secondary | ICD-10-CM | POA: Diagnosis not present

## 2017-12-03 DIAGNOSIS — I700 Atherosclerosis of aorta: Secondary | ICD-10-CM | POA: Insufficient documentation

## 2017-12-03 DIAGNOSIS — K760 Fatty (change of) liver, not elsewhere classified: Secondary | ICD-10-CM | POA: Diagnosis not present

## 2017-12-03 DIAGNOSIS — N200 Calculus of kidney: Secondary | ICD-10-CM | POA: Insufficient documentation

## 2017-12-03 DIAGNOSIS — R3129 Other microscopic hematuria: Secondary | ICD-10-CM | POA: Insufficient documentation

## 2017-12-03 MED ORDER — IOPAMIDOL (ISOVUE-300) INJECTION 61%
125.0000 mL | Freq: Once | INTRAVENOUS | Status: AC | PRN
  Administered 2017-12-03: 125 mL via INTRAVENOUS

## 2017-12-16 ENCOUNTER — Other Ambulatory Visit: Payer: Self-pay

## 2017-12-16 ENCOUNTER — Ambulatory Visit (INDEPENDENT_AMBULATORY_CARE_PROVIDER_SITE_OTHER): Payer: Medicare Other | Admitting: Urology

## 2017-12-16 ENCOUNTER — Encounter: Payer: Self-pay | Admitting: Urology

## 2017-12-16 VITALS — BP 164/72 | HR 68 | Ht 65.5 in | Wt 184.1 lb

## 2017-12-16 DIAGNOSIS — R3129 Other microscopic hematuria: Secondary | ICD-10-CM | POA: Diagnosis not present

## 2017-12-16 LAB — URINALYSIS, COMPLETE
BILIRUBIN UA: NEGATIVE
Glucose, UA: NEGATIVE
Ketones, UA: NEGATIVE
Nitrite, UA: NEGATIVE
PH UA: 7 (ref 5.0–7.5)
Protein, UA: NEGATIVE
RBC UA: NEGATIVE
Specific Gravity, UA: 1.02 (ref 1.005–1.030)
Urobilinogen, Ur: 0.2 mg/dL (ref 0.2–1.0)

## 2017-12-16 LAB — MICROSCOPIC EXAMINATION: RBC MICROSCOPIC, UA: NONE SEEN /HPF (ref 0–2)

## 2017-12-16 NOTE — Progress Notes (Signed)
Cystoscopy Procedure Note:  Indication: Microscopic hematuria  After informed consent and discussion of the procedure and its risks, Danielle Richardson was positioned and prepped in the standard fashion. Cystoscopy was performed with a flexible cystoscope. The urethra, bladder neck and entire bladder was visualized in a standard fashion. The mucosa was grossly normal throughout. The ureteral orifices were visualized in their normal location and orientation. Cytology sent.  Imaging: CT urogram 12/04/2017 personally reviewed: 1.4cm right lower pole non-obstructing stone. 1200HU, SSD 17cm  Findings: Normal cystoscopy  Assessment and Plan: Danielle Richardson is a 72 year old female here for microscopic hematuria work-up, with chronic pelvic pain.  CT shows a right lower pole 1.4 cm asymptomatic stone.  We discussed management options including surveillance, shockwave lithotripsy, and ureteroscopy with laser lithotripsy and stent placement.  Stone is not a good candidate for shockwave secondary to large skin to stone distance. No explanation for her chronic pelvic pain, cystoscopy unremarkable.  Regarding her chronic pelvic pain, recommend referral to pelvic floor physical therapy.  Follow-up in 1 year with K[facility] for stone surveillance.  Billey Co, MD 12/16/2017

## 2017-12-19 ENCOUNTER — Other Ambulatory Visit: Payer: Self-pay | Admitting: Urology

## 2017-12-23 ENCOUNTER — Telehealth: Payer: Self-pay | Admitting: Family Medicine

## 2017-12-23 NOTE — Telephone Encounter (Signed)
-----   Message from Billey Co, MD sent at 12/23/2017 12:49 PM EDT ----- Regarding: negative cytology Please let her know her cytology is negative. Thanks  Nickolas Madrid, MD 12/23/2017   ----- Message ----- From: Delon Sacramento D Sent: 12/19/2017   2:49 PM EDT To: Billey Co, MD

## 2017-12-23 NOTE — Telephone Encounter (Signed)
LMOM and gave normal result.

## 2018-01-15 NOTE — Progress Notes (Signed)
ANNUAL PREVENTATIVE CARE GYN  ENCOUNTER NOTE  Subjective:       Danielle Richardson is a 72 y.o. G39P2002 female here for a routine annual gynecologic exam.  Current complaints: 1. Patient c/o continuous pelvic pain.   Bowel function is stable without change in symptomatology. No blood per rectum. Patient does have urinary frequency and nocturia for which she is on oxybutynin. She is taking this medication one half tablet 5 mg at bedtime or every other night.  Danielle Richardson is experiencing significant chronic vaginal pain over the past 1 to 2 years.  This has impacted her ability to walk/exercise.  She is no longer having intercourse because of the severity of the pain.  She states that the pain is sharp stabbing and ache like and can last anywhere from minutes to days.  Increased activity, leaning over, and sitting exacerbates the pain.  Sex exacerbates the pain.  She is status post a pubovaginal sling in the past.   Gynecologic History No LMP recorded. Patient is postmenopausal. Contraception: post menopausal status Last IBB:CWUGQB no h/o anb. Results were: normal Last mammogram: 10/2017 birad 1. Results were: normal  Obstetric History OB History  Gravida Para Term Preterm AB Living  2 2 2     2   SAB TAB Ectopic Multiple Live Births          2    # Outcome Date GA Lbr Len/2nd Weight Sex Delivery Anes PTL Lv  2 Term 1972   7 lb 1.8 oz (3.225 kg) F Vag-Spont   LIV  1 Term 1968   6 lb 11.2 oz (3.039 kg) M Vag-Spont   LIV    Past Medical History:  Diagnosis Date  . Arthritis   . Cataract   . Chronic obstructive pulmonary disease ([place]) 09/11/2013  . Coronary artery disease involving native coronary artery of native heart 09/23/2016   Overview:  Minimal by cath 2017  . GERD (gastroesophageal reflux disease)   . HLD (hyperlipidemia) 09/11/2013  . Joint pain   . Left bundle branch block   . Migraine   . Pelvic pain in female   . SUI (stress urinary incontinence, female)   . Vertigo   .  Vertigo     Past Surgical History:  Procedure Laterality Date  . AUGMENTATION MAMMAPLASTY Bilateral    Pt had implants and then removed  . bladder tack  1999  . CARDIAC CATHETERIZATION N/A 09/12/2015   Procedure: Left Heart Cath and Coronary Angiography;  Surgeon: Corey Skains, MD;  Location: [facility];  Service: Cardiovascular;  Laterality: N/A;  . CATARACT EXTRACTION W/ INTRAOCULAR LENS  IMPLANT, BILATERAL  2018  . CHOLECYSTECTOMY  1994  . COLONOSCOPY WITH PROPOFOL N/A 02/25/2017   Procedure: COLONOSCOPY WITH PROPOFOL;  Surgeon: Virgel Manifold, MD;  Location: [facility];  Service: Endoscopy;  Laterality: N/A;  . ESOPHAGOGASTRODUODENOSCOPY (EGD) WITH PROPOFOL N/A 02/25/2017   Procedure: ESOPHAGOGASTRODUODENOSCOPY (EGD) WITH PROPOFOL;  Surgeon: Virgel Manifold, MD;  Location: [facility];  Service: Endoscopy;  Laterality: N/A;  . EYE SURGERY    . HYSTEROSCOPY W/D&C N/A 01/08/2016   Procedure: DILATATION AND CURETTAGE /HYSTEROSCOPY;  Surgeon: Brayton Mars, MD;  Location: [facility];  Service: Gynecology;  Laterality: N/A;  . JOINT REPLACEMENT  2010   right shoulder  . NECK SURGERY  2010   Cervical Discectomy with fusion & plating  . SHOULDER SURGERY Right 2010   Total Shoulder Replacement, [facility]  . TOE SURGERY  Current Outpatient Medications on File Prior to Visit  Medication Sig Dispense Refill  . aspirin EC 81 MG tablet Take 81 mg by mouth daily.     . Calcium-Vitamin D-Vitamin K 500-100-40 MG-UNT-MCG CHEW Chew 1 Dose by mouth daily.    . clindamycin (CLEOCIN) 150 MG capsule Take 150 mg by mouth as needed. 4 tablets prior to dental appointment    . conjugated estrogens (PREMARIN) vaginal cream Place 4.62 Applicatorfuls vaginally 2 (two) times a week. 42.5 g 12  . fluticasone (FLONASE) 50 MCG/ACT nasal spray Place 2 sprays into the nose at bedtime.     . hydrocortisone 2.5 % cream Apply topically.    . meclizine  (ANTIVERT) 25 MG tablet Take 25 mg by mouth 3 (three) times daily as needed.     . meloxicam (MOBIC) 7.5 MG tablet Take 7.5 mg by mouth daily. May take twice a day if needed    . metoprolol succinate (TOPROL-XL) 50 MG 24 hr tablet     . mirabegron ER (MYRBETRIQ) 25 MG TB24 tablet Take 1 tablet (25 mg total) by mouth daily. 1 tablet 0  . Multiple Vitamins-Minerals (MULTIVITAMIN GUMMIES ADULT PO) Take 1 Dose by mouth daily.    Marland Kitchen oxybutynin (DITROPAN) 5 MG tablet Take 1 tablet (5 mg total) by mouth at bedtime. 90 tablet 3  . oxyCODONE (OXY IR/ROXICODONE) 5 MG immediate release tablet     . pantoprazole (PROTONIX) 40 MG tablet Take 40 mg by mouth 2 (two) times daily.     . polyethylene glycol powder (GLYCOLAX/MIRALAX) powder Take by mouth as needed.     . ranitidine (ZANTAC) 300 MG tablet     . rizatriptan (MAXALT) 10 MG tablet Take 10 mg by mouth as needed.     . rosuvastatin (CRESTOR) 5 MG tablet      No current facility-administered medications on file prior to visit.     Allergies  Allergen Reactions  . Penicillins Rash  . Pravastatin Other (See Comments)    Body aches  . Alendronate Nausea Only  . Codeine Nausea Only  . Oseltamivir Rash  . Rofecoxib Swelling    Swelling of feet    Social History   Socioeconomic History  . Marital status: Married    Spouse name: Not on file  . Number of children: Not on file  . Years of education: Not on file  . Highest education level: Not on file  Occupational History  . Not on file  Social Needs  . Financial resource strain: Not on file  . Food insecurity:    Worry: Not on file    Inability: Not on file  . Transportation needs:    Medical: Not on file    Non-medical: Not on file  Tobacco Use  . Smoking status: Former Smoker    Last attempt to quit: 04/02/1999    Years since quitting: 18.8  . Smokeless tobacco: Never Used  Substance and Sexual Activity  . Alcohol use: Yes    Comment: rare - 1-2x/yr  . Drug use: No  . Sexual  activity: Not Currently    Birth control/protection: Post-menopausal  Lifestyle  . Physical activity:    Days per week: Not on file    Minutes per session: Not on file  . Stress: Not on file  Relationships  . Social connections:    Talks on phone: Not on file    Gets together: Not on file    Attends religious service: Not on file  Active member of club or organization: Not on file    Attends meetings of clubs or organizations: Not on file    Relationship status: Not on file  . Intimate partner violence:    Fear of current or ex partner: Not on file    Emotionally abused: Not on file    Physically abused: Not on file    Forced sexual activity: Not on file  Other Topics Concern  . Not on file  Social History Narrative  . Not on file    Family History  Problem Relation Age of Onset  . Diabetes Father   . Diabetes Brother   . Colon cancer Maternal Uncle   . Ovarian cancer Maternal Grandfather   . Breast cancer Neg Hx     The following portions of the patient's history were reviewed and updated as appropriate: allergies, current medications, past family history, past medical history, past social history, past surgical history and problem list.  Review of Systems Review of Systems  Constitutional: Negative.   HENT: Negative.   Eyes: Negative.   Respiratory: Negative.   Cardiovascular: Negative.   Gastrointestinal: Negative.   Genitourinary: Negative for dysuria, flank pain, frequency and urgency.       Vaginal pain  Musculoskeletal: Positive for joint pain.       Limited range of motion right shoulder status post joint replacement right shoulder  Skin: Negative.     Objective:   Blood pressure (!) 146/89, pulse 65, height 5' 5.5" (1.664 m), weight 184 lb 12.8 oz (83.8 kg).   CONSTITUTIONAL: Well-developed, well-nourished female in no acute distress.  PSYCHIATRIC: Normal mood and affect. Normal behavior. Normal judgment and thought content. [place]: Alert and  oriented to person, place, and time. Normal muscle tone coordination. No cranial nerve deficit noted. HENT:  Normocephalic, atraumatic, External right and left ear normal.  EYES: Conjunctivae and EOM are normal.No scleral icterus.  NECK: Normal range of motion, supple, no masses.  Normal thyroid.  SKIN: Skin is warm and dry. No rash noted. Not diaphoretic. No erythema. No pallor. CARDIOVASCULAR: Normal heart rate noted, regular rhythm, no murmur. RESPIRATORY: Clear to auscultation bilaterally. Effort and breath sounds normal, no problems with respiration noted. BREASTS: Symmetric in size. No masses, skin changes, nipple drainage, or lymphadenopathy. ABDOMEN: Soft, normal bowel sounds, no distention noted.  No tenderness, rebound or guarding.  BLADDER: Normal; nontender PELVIC:  External Genitalia: Normal  BUS: Normal  Vagina: Fair estrogen effect; bimanual reveals tenderness in distal anterior vagina and left side of vagina approximately 3 cm from the introitus in the region of the hymenal ring; this does correlates with her chronic pain.  Isolated focus (trigger point) is appreciated  Cervix: Normal; no lesions; no cervical motion tenderness  Uterus: Normal; midplane, normal size and shape, mobile, nontender  Adnexa: Normal; nonpalpable nontender  RV: External Exam NormaI internal rectal exam is not done MUSCULOSKELETAL: Limited range of motion of right arm.  No cyanosis, clubbing, or edema.  2+ distal pulses. LYMPHATIC: No Axillary, Supraclavicular, or Inguinal Adenopathy.    Assessment:   Annual gynecologic examination 72 y.o. Contraception: post menopausal status Overweight Unstable bladder, under fair control with oxybutynin therapy 5 mg every other night Vaginal pain, focal, located at distal vagina at approximately 3:00 at hymeneal ring (trigger point); review of CT scan shows diverticulosis; normal reproductive tract   Plan:  Pap: Not needed Mammogram: UTD Stool Guaiac  Testing:  Ordered Labs: thur pcp Routine preventative health maintenance measures emphasized: Exercise/Diet/Weight control,  Tobacco Warnings and Alcohol/Substance use risks  Refill oxybutynin 5 mg every night to every other night Trial of gabapentin 300 mg daily x1, then twice daily x1, then 3 times daily thereafter Return in 2 weeks for steroid injection/local anesthetic injection Return to [place] [facility]-Dr. Cecille Rubin, CMA  Brayton Mars, MD   Note: This dictation was prepared with Dragon dictation along with smaller phrase technology. Any transcriptional errors that result from this process are unintentional.

## 2018-01-20 ENCOUNTER — Encounter: Payer: Medicare Other | Admitting: Obstetrics and Gynecology

## 2018-01-21 ENCOUNTER — Other Ambulatory Visit: Payer: Self-pay

## 2018-01-21 ENCOUNTER — Encounter: Payer: Self-pay | Admitting: Obstetrics and Gynecology

## 2018-01-21 ENCOUNTER — Ambulatory Visit (INDEPENDENT_AMBULATORY_CARE_PROVIDER_SITE_OTHER): Payer: Medicare Other | Admitting: Obstetrics and Gynecology

## 2018-01-21 VITALS — BP 146/89 | HR 65 | Ht 65.5 in | Wt 184.8 lb

## 2018-01-21 DIAGNOSIS — R102 Pelvic and perineal pain unspecified side: Secondary | ICD-10-CM

## 2018-01-21 DIAGNOSIS — Z1211 Encounter for screening for malignant neoplasm of colon: Secondary | ICD-10-CM | POA: Diagnosis not present

## 2018-01-21 DIAGNOSIS — Z780 Asymptomatic menopausal state: Secondary | ICD-10-CM | POA: Diagnosis not present

## 2018-01-21 DIAGNOSIS — N952 Postmenopausal atrophic vaginitis: Secondary | ICD-10-CM | POA: Diagnosis not present

## 2018-01-21 DIAGNOSIS — Z01411 Encounter for gynecological examination (general) (routine) with abnormal findings: Secondary | ICD-10-CM

## 2018-01-21 DIAGNOSIS — N3946 Mixed incontinence: Secondary | ICD-10-CM

## 2018-01-21 DIAGNOSIS — N941 Unspecified dyspareunia: Secondary | ICD-10-CM

## 2018-01-21 DIAGNOSIS — E663 Overweight: Secondary | ICD-10-CM

## 2018-01-21 MED ORDER — OXYBUTYNIN CHLORIDE 5 MG PO TABS: 5.0000 mg | ORAL_TABLET | Freq: Every day | ORAL | 3 refills | Status: DC

## 2018-01-21 MED ORDER — GABAPENTIN 300 MG PO CAPS: 300.0000 mg | ORAL_CAPSULE | Freq: Three times a day (TID) | ORAL | 1 refills | Status: DC

## 2018-01-21 NOTE — Patient Instructions (Addendum)
1.  Pap smear is not done.  No further Pap smears are needed. 2.  Mammogram screening is up-to-date. 3.  Stool guaiac cards are given for colon cancer screening 4.  Screening labs are obtained through primary care. 5.  Continue with healthy eating and exercise. 6.  Continue with calcium and vitamin D supplementation. 7.  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  Return in 1 year for annual Medicare physical-Dr. Amalia Hailey 9.  Begin a trial of gabapentin 300 mg: Take 1 tablet a day for 1 day, then 1 tablet twice a day for 1 day, then 3 times a day thereafter. 10.  Return in 2 weeks for treatment of vaginal pain with steroid injection and local anesthetic.   Health Maintenance for Postmenopausal Women Menopause is a normal process in which your reproductive ability comes to an end. This process happens gradually over a span of months to years, usually between the ages of 39 and 72. Menopause is complete when you have missed 12 consecutive menstrual periods. It is important to talk with your health care provider about some of the most common conditions that affect postmenopausal women, such as heart disease, cancer, and bone loss (osteoporosis). Adopting a healthy lifestyle and getting preventive care can help to promote your health and wellness. Those actions can also lower your chances of developing some of these common conditions. What should I know about menopause? During menopause, you may experience a number of symptoms, such as:  Moderate-to-severe hot flashes.  Night sweats.  Decrease in sex drive.  Mood swings.  Headaches.  Tiredness.  Irritability.  Memory problems.  Insomnia.  Choosing to treat or not to treat menopausal changes is an individual decision that you make with your health care provider. What should I know about hormone replacement therapy and supplements? Hormone therapy products are effective for treating symptoms that are associated  with menopause, such as hot flashes and night sweats. Hormone replacement carries certain risks, especially as you become older. If you are thinking about using estrogen or estrogen with progestin treatments, discuss the benefits and risks with your health care provider. What should I know about heart disease and stroke? Heart disease, heart attack, and stroke become more likely as you age. This may be due, in part, to the hormonal changes that your body experiences during menopause. These can affect how your body processes dietary fats, triglycerides, and cholesterol. Heart attack and stroke are both medical emergencies. There are many things that you can do to help prevent heart disease and stroke:  Have your blood pressure checked at least every 1-2 years. High blood pressure causes heart disease and increases the risk of stroke.  If you are 54-9 years old, ask your health care provider if you should take aspirin to prevent a heart attack or a stroke.  Do not use any tobacco products, including cigarettes, chewing tobacco, or electronic cigarettes. If you need help quitting, ask your health care provider.  It is important to eat a healthy diet and maintain a healthy weight. ? Be sure to include plenty of vegetables, fruits, low-fat dairy products, and lean protein. ? Avoid eating foods that are high in solid fats, added sugars, or salt (sodium).  Get regular exercise. This is one of the most important things that you can do for your health. ? Try to exercise for at least 150 minutes each week. The type of exercise that you do should increase your heart rate and make  you sweat. This is known as moderate-intensity exercise. ? Try to do strengthening exercises at least twice each week. Do these in addition to the moderate-intensity exercise.  Know your numbers.Ask your health care provider to check your cholesterol and your blood glucose. Continue to have your blood tested as directed by your  health care provider.  What should I know about cancer screening? There are several types of cancer. Take the following steps to reduce your risk and to catch any cancer development as early as possible. Breast Cancer  Practice breast self-awareness. ? This means understanding how your breasts normally appear and feel. ? It also means doing regular breast self-exams. Let your health care provider know about any changes, no matter how small.  If you are 36 or older, have a clinician do a breast exam (clinical breast exam or CBE) every year. Depending on your age, family history, and medical history, it may be recommended that you also have a yearly breast X-ray (mammogram).  If you have a family history of breast cancer, talk with your health care provider about genetic screening.  If you are at high risk for breast cancer, talk with your health care provider about having an MRI and a mammogram every year.  Breast cancer (BRCA) gene test is recommended for women who have family members with BRCA-related cancers. Results of the assessment will determine the need for genetic counseling and BRCA1 and for BRCA2 testing. BRCA-related cancers include these types: ? Breast. This occurs in males or females. ? Ovarian. ? Tubal. This may also be called fallopian tube cancer. ? Cancer of the abdominal or pelvic lining (peritoneal cancer). ? Prostate. ? Pancreatic.  Cervical, Uterine, and Ovarian Cancer Your health care provider may recommend that you be screened regularly for cancer of the pelvic organs. These include your ovaries, uterus, and vagina. This screening involves a pelvic exam, which includes checking for microscopic changes to the surface of your cervix (Pap test).  For women ages 21-65, health care providers may recommend a pelvic exam and a Pap test every three years. For women ages 55-65, they may recommend the Pap test and pelvic exam, combined with testing for human papilloma virus  (HPV), every five years. Some types of HPV increase your risk of cervical cancer. Testing for HPV may also be done on women of any age who have unclear Pap test results.  Other health care providers may not recommend any screening for nonpregnant women who are considered low risk for pelvic cancer and have no symptoms. Ask your health care provider if a screening pelvic exam is right for you.  If you have had past treatment for cervical cancer or a condition that could lead to cancer, you need Pap tests and screening for cancer for at least 20 years after your treatment. If Pap tests have been discontinued for you, your risk factors (such as having a new sexual partner) need to be reassessed to determine if you should start having screenings again. Some women have medical problems that increase the chance of getting cervical cancer. In these cases, your health care provider may recommend that you have screening and Pap tests more often.  If you have a family history of uterine cancer or ovarian cancer, talk with your health care provider about genetic screening.  If you have vaginal bleeding after reaching menopause, tell your health care provider.  There are currently no reliable tests available to screen for ovarian cancer.  Lung Cancer Lung cancer screening  is recommended for adults 69-28 years old who are at high risk for lung cancer because of a history of smoking. A yearly low-dose CT scan of the lungs is recommended if you:  Currently smoke.  Have a history of at least 30 pack-years of smoking and you currently smoke or have quit within the past 15 years. A pack-year is smoking an average of one pack of cigarettes per day for one year.  Yearly screening should:  Continue until it has been 15 years since you quit.  Stop if you develop a health problem that would prevent you from having lung cancer treatment.  Colorectal Cancer  This type of cancer can be detected and can often be  prevented.  Routine colorectal cancer screening usually begins at age 42 and continues through age 55.  If you have risk factors for colon cancer, your health care provider may recommend that you be screened at an earlier age.  If you have a family history of colorectal cancer, talk with your health care provider about genetic screening.  Your health care provider may also recommend using home test kits to check for hidden blood in your stool.  A small camera at the end of a tube can be used to examine your colon directly (sigmoidoscopy or colonoscopy). This is done to check for the earliest forms of colorectal cancer.  Direct examination of the colon should be repeated every 5-10 years until age 48. However, if early forms of precancerous polyps or small growths are found or if you have a family history or genetic risk for colorectal cancer, you may need to be screened more often.  Skin Cancer  Check your skin from head to toe regularly.  Monitor any moles. Be sure to tell your health care provider: ? About any new moles or changes in moles, especially if there is a change in a mole's shape or color. ? If you have a mole that is larger than the size of a pencil eraser.  If any of your family members has a history of skin cancer, especially at a young age, talk with your health care provider about genetic screening.  Always use sunscreen. Apply sunscreen liberally and repeatedly throughout the day.  Whenever you are outside, protect yourself by wearing long sleeves, pants, a wide-brimmed hat, and sunglasses.  What should I know about osteoporosis? Osteoporosis is a condition in which bone destruction happens more quickly than new bone creation. After menopause, you may be at an increased risk for osteoporosis. To help prevent osteoporosis or the bone fractures that can happen because of osteoporosis, the following is recommended:  If you are 62-9 years old, get at least 1,000 mg of  calcium and at least 600 mg of vitamin D per day.  If you are older than age 42 but younger than age 54, get at least 1,200 mg of calcium and at least 600 mg of vitamin D per day.  If you are older than age 35, get at least 1,200 mg of calcium and at least 800 mg of vitamin D per day.  Smoking and excessive alcohol intake increase the risk of osteoporosis. Eat foods that are rich in calcium and vitamin D, and do weight-bearing exercises several times each week as directed by your health care provider. What should I know about how menopause affects my mental health? Depression may occur at any age, but it is more common as you become older. Common symptoms of depression include:  Low or  sad mood.  Changes in sleep patterns.  Changes in appetite or eating patterns.  Feeling an overall lack of motivation or enjoyment of activities that you previously enjoyed.  Frequent crying spells.  Talk with your health care provider if you think that you are experiencing depression. What should I know about immunizations? It is important that you get and maintain your immunizations. These include:  Tetanus, diphtheria, and pertussis (Tdap) booster vaccine.  Influenza every year before the flu season begins.  Pneumonia vaccine.  Shingles vaccine.  Your health care provider may also recommend other immunizations. This information is not intended to replace advice given to you by your health care provider. Make sure you discuss any questions you have with your health care provider. Document Released: 05/10/2005 Document Revised: 10/06/2015 Document Reviewed: 12/20/2014 Elsevier Interactive Patient Education  2018 [employer]. Gabapentin capsules or tablets What is this medicine? GABAPENTIN (GA ba pen tin) is used to control partial seizures in adults with epilepsy. It is also used to treat certain types of nerve pain. This medicine may be used for other purposes; ask your health care provider  or pharmacist if you have questions. COMMON BRAND NAME(S): Active-PAC with Gabapentin, Gabarone, Neurontin What should I tell my health care provider before I take this medicine? They need to know if you have any of these conditions: -kidney disease -suicidal thoughts, plans, or attempt; a previous suicide attempt by you or a family member -an unusual or allergic reaction to gabapentin, other medicines, foods, dyes, or preservatives -pregnant or trying to get pregnant -breast-feeding How should I use this medicine? Take this medicine by mouth with a glass of water. Follow the directions on the prescription label. You can take it with or without food. If it upsets your stomach, take it with food.Take your medicine at regular intervals. Do not take it more often than directed. Do not stop taking except on your doctor's advice. If you are directed to break the 600 or 800 mg tablets in half as part of your dose, the extra half tablet should be used for the next dose. If you have not used the extra half tablet within 28 days, it should be thrown away. A special MedGuide will be given to you by the pharmacist with each prescription and refill. Be sure to read this information carefully each time. Talk to your pediatrician regarding the use of this medicine in children. Special care may be needed. Overdosage: If you think you have taken too much of this medicine contact a poison control center or emergency room at once. NOTE: This medicine is only for you. Do not share this medicine with others. What if I miss a dose? If you miss a dose, take it as soon as you can. If it is almost time for your next dose, take only that dose. Do not take double or extra doses. What may interact with this medicine? Do not take this medicine with any of the following medications: -other gabapentin products This medicine may also interact with the following medications: -alcohol -antacids -antihistamines for allergy,  cough and cold -certain medicines for anxiety or sleep -certain medicines for depression or psychotic disturbances -homatropine; hydrocodone -naproxen -narcotic medicines (opiates) for pain -phenothiazines like chlorpromazine, mesoridazine, prochlorperazine, thioridazine This list may not describe all possible interactions. Give your health care provider a list of all the medicines, herbs, non-prescription drugs, or dietary supplements you use. Also tell them if you smoke, drink alcohol, or use illegal drugs. Some items may  interact with your medicine. What should I watch for while using this medicine? Visit your doctor or health care professional for regular checks on your progress. You may want to keep a record at home of how you feel your condition is responding to treatment. You may want to share this information with your doctor or health care professional at each visit. You should contact your doctor or health care professional if your seizures get worse or if you have any new types of seizures. Do not stop taking this medicine or any of your seizure medicines unless instructed by your doctor or health care professional. Stopping your medicine suddenly can increase your seizures or their severity. Wear a medical identification bracelet or chain if you are taking this medicine for seizures, and carry a card that lists all your medications. You may get drowsy, dizzy, or have blurred vision. Do not drive, use machinery, or do anything that needs mental alertness until you know how this medicine affects you. To reduce dizzy or fainting spells, do not sit or stand up quickly, especially if you are an older patient. Alcohol can increase drowsiness and dizziness. Avoid alcoholic drinks. Your mouth may get dry. Chewing sugarless gum or sucking hard candy, and drinking plenty of water will help. The use of this medicine may increase the chance of suicidal thoughts or actions. Pay special attention to how  you are responding while on this medicine. Any worsening of mood, or thoughts of suicide or dying should be reported to your health care professional right away. Women who become pregnant while using this medicine may enroll in the [place] Pregnancy Registry by calling [phone]. This registry collects information about the safety of antiepileptic drug use during pregnancy. What side effects may I notice from receiving this medicine? Side effects that you should report to your doctor or health care professional as soon as possible: -allergic reactions like skin rash, itching or hives, swelling of the face, lips, or tongue -worsening of mood, thoughts or actions of suicide or dying Side effects that usually do not require medical attention (report to your doctor or health care professional if they continue or are bothersome): -constipation -difficulty walking or controlling muscle movements -dizziness -nausea -slurred speech -tiredness -tremors -weight gain This list may not describe all possible side effects. Call your doctor for medical advice about side effects. You may report side effects to FDA at 1-800-FDA-1088. Where should I keep my medicine? Keep out of reach of children. This medicine may cause accidental overdose and death if it taken by other adults, children, or pets. Mix any unused medicine with a substance like cat litter or coffee grounds. Then throw the medicine away in a sealed container like a sealed bag or a coffee can with a lid. Do not use the medicine after the expiration date. Store at room temperature between 15 and 30 degrees C (59 and 86 degrees F). NOTE: This sheet is a summary. It may not cover all possible information. If you have questions about this medicine, talk to your doctor, pharmacist, or health care provider.  2018 Elsevier/Gold Standard (2013-05-14 15:26:50)

## 2018-02-04 ENCOUNTER — Encounter: Payer: Medicare Other | Admitting: Obstetrics and Gynecology

## 2018-02-11 ENCOUNTER — Ambulatory Visit (INDEPENDENT_AMBULATORY_CARE_PROVIDER_SITE_OTHER): Payer: Medicare Other | Admitting: Obstetrics and Gynecology

## 2018-02-11 VITALS — BP 136/73 | Ht 65.5 in | Wt 184.7 lb

## 2018-02-11 DIAGNOSIS — M791 Myalgia, unspecified site: Secondary | ICD-10-CM

## 2018-02-11 DIAGNOSIS — R102 Pelvic and perineal pain: Secondary | ICD-10-CM | POA: Diagnosis not present

## 2018-02-11 DIAGNOSIS — N941 Unspecified dyspareunia: Secondary | ICD-10-CM

## 2018-02-11 NOTE — Progress Notes (Signed)
Chief complaint: 1.  Pelvic pain 2.  Dyspareunia 3.  Trigger point, vagina  Danielle Richardson presents today for injection of trigger point causing chronic pelvic pain and dyspareunia.  Extensive work-up has not led to the etiology of her pain.  Location of the pain is left vaginal sidewall just at the introitus at approximately 5 o'clock position where point tenderness is isolated.  PROCEDURE: Trigger point injection Patient gives verbal consent.  She is placed in dorsolithotomy position.  The vagina is painted with Betadine.  The trigger point is identified with palpation and 2 injections are performed with a 22-gauge spinal needle on a controlled syringe.  First injection is a combination of 1% lidocaine without epinephrine (3 cc - 30 mg), and 0.25% Sensorcaine (3 cc - 15 mg) mixed together.  Second injection is (3 cc) of betamethasone-18 mg total, similarly injected.  Care was taken to not inject intravascularly-aspiration performed prior to injections.  Procedure was well-tolerated except for some local burning with administration of the anesthetic agent.  Complications were none.  Blood loss was negligible.  ASSESSMENT: 1.  Chronic pelvic pain and dyspareunia isolated to trigger point on left vaginal sidewall at introitus.  PLAN: 1.  Trigger point injection x2:  First-combination of 1% lidocaine without epinephrine 3 cc (30 mg), and 0.25% Sensorcaine 3 cc (15 mg)  Second-betamethasone 3 cc (18 mg) 2.  Return in 2 weeks for follow-up  Brayton Mars, MD  Note: This dictation was prepared with Dragon dictation along with smaller phrase technology. Any transcriptional errors that result from this process are unintentional.

## 2018-02-11 NOTE — Patient Instructions (Signed)
1.  Trigger point injection was completed today. 2.  Return in 2 to 3 weeks for follow-up.

## 2018-03-03 ENCOUNTER — Encounter: Payer: Medicare Other | Admitting: Obstetrics and Gynecology

## 2018-03-04 ENCOUNTER — Encounter: Payer: Self-pay | Admitting: Obstetrics and Gynecology

## 2018-03-04 ENCOUNTER — Ambulatory Visit: Payer: Medicare Other | Admitting: Obstetrics and Gynecology

## 2018-03-04 VITALS — BP 118/69 | HR 59 | Ht 66.0 in | Wt 187.1 lb

## 2018-03-04 DIAGNOSIS — N941 Unspecified dyspareunia: Secondary | ICD-10-CM | POA: Diagnosis not present

## 2018-03-04 DIAGNOSIS — M791 Myalgia, unspecified site: Secondary | ICD-10-CM

## 2018-03-04 MED ORDER — GABAPENTIN 300 MG PO CAPS: 300.0000 mg | ORAL_CAPSULE | Freq: Every day | ORAL | 1 refills | Status: DC

## 2018-03-04 NOTE — Patient Instructions (Signed)
1.  Decrease gabapentin to 300 mg daily for 1 week; then decrease it to every other day for 1 week; then decrease it to twice weekly for 1 week; then discontinue. 2.  If pelvic pain and shoulder pain recur, consider restarting the gabapentin 300 mg every other day or daily. 3.  Return in 10 weeks for follow-up with Dr. Marcelline Mates.

## 2018-03-05 NOTE — Progress Notes (Signed)
Chief complaint: 1.  Pelvic pain 2.  Dyspareunia 3.  Trigger point, vagina  Danielle Richardson presents today for 2-week follow-up after having local injection for management of trigger point pain within the vagina.  The patient has had an extensive work-up for pelvic pain which came up negative for etiology.  I clinical exam she had a palpable nodular area of tenderness just inside the introitus at the posterior fourchette at approximately 5:00.  Palpation of this area reproduced her trigger point pain.  The patient is status post pubovaginal sling, and her pain is not in the location of the sling on the anterior vagina.  Since her injection, Danielle Richardson has noted significant reduction in her pain symptomatology.  She is able to function more normally on a day-to-day basis over the past 2 weeks although she does feel like some of the relief is starting to wear off.  She also has been taking gabapentin to help with pain relief.  She was intolerant of multi-dosing of the 300 mg gabapentin (previously described as every 8 hours); patient is not taking medication once a day and has noted definite help with her discomfort.  She also states that has helped some shoulder pain she has been experiencing in the recent past.  Trigger point injection done 2 weeks ago:  02/11/2018  2 trigger point injections   First 6 mL injection-3 cc 1% lidocaine plain, mixed with 3 cc 0.25% Sensorcaine   Second 3 mL injection-3 cc betamethasone (18 mg total)  Past medical history, past surgical history, problem list, medications, and allergies are reviewed  OBJECTIVE: BP 118/69   Pulse (!) 59   Ht 5\' 6"  (1.676 m)   Wt 187 lb 1.6 oz (84.9 kg)   BMI 30.20 kg/m  Pleasant female no acute distress.  Alert and oriented.  Affect is appropriate. Abdomen: Soft, nontender Pelvic exam: Surgeon today-normal [facility]-normal Vagina-3 cm inside the introitus at approximately 5:00 is a palpable tender area, slightly nodular, that reproduces her pelvic  pain; the pain today is characterized as a 3 out of 10.  This is significantly better than prior to her trigger point injection.  ASSESSMENT: 1.  Trigger point pain, vagina, just inside the posterior fourchette/antritis at approximately 5:00, moderately improved following injection 2.  Patient reports slight rash 3 to 4 days after the injection which was short-lived and not associated with pruritus. 3.  Gabapentin intolerance noted at high dosage (300 mg 3 times a day); medication tolerated at 300 mg daily  PLAN: 1.  Return in 10 weeks for follow-up on pelvic pain-trigger point-Dr. Marcelline Mates 2.  Continue with gabapentin 300 mg every day to every other day as tolerated to help with pain management 3.  Patient understands that she may require repeat trigger point injections as frequent as quarterly to help control pain and improve quality of life.  Dr. Marcelline Mates will assume ongoing care.  A total of 15 minutes were spent face-to-face with the patient during this encounter and over half of that time dealt with counseling and coordination of care.  Brayton Mars, MD  Note: This dictation was prepared with Dragon dictation along with smaller phrase technology. Any transcriptional errors that result from this process are unintentional.

## 2018-05-01 ENCOUNTER — Other Ambulatory Visit: Payer: Self-pay

## 2018-05-01 ENCOUNTER — Telehealth: Payer: Self-pay | Admitting: Gastroenterology

## 2018-05-01 DIAGNOSIS — Z86010 Personal history of colonic polyps: Secondary | ICD-10-CM

## 2018-05-01 NOTE — Telephone Encounter (Signed)
LVM returning patients call to schedule her colonoscopy.  Asked her to call me back.  Thanks [employer]

## 2018-05-01 NOTE — Telephone Encounter (Signed)
Pt is calling  To schedule a clonoscopy

## 2018-05-06 ENCOUNTER — Encounter: Payer: Self-pay | Admitting: Obstetrics and Gynecology

## 2018-05-06 ENCOUNTER — Ambulatory Visit: Payer: Medicare Other | Admitting: Obstetrics and Gynecology

## 2018-05-06 VITALS — BP 151/78 | HR 70 | Ht 65.0 in | Wt 186.4 lb

## 2018-05-06 DIAGNOSIS — M791 Myalgia, unspecified site: Secondary | ICD-10-CM | POA: Diagnosis not present

## 2018-05-06 DIAGNOSIS — M6289 Other specified disorders of muscle: Secondary | ICD-10-CM | POA: Diagnosis not present

## 2018-05-06 DIAGNOSIS — N941 Unspecified dyspareunia: Secondary | ICD-10-CM | POA: Diagnosis not present

## 2018-05-06 DIAGNOSIS — N952 Postmenopausal atrophic vaginitis: Secondary | ICD-10-CM

## 2018-05-06 MED ORDER — LIDOCAINE 5 % EX OINT: 1.0000 "application " | TOPICAL_OINTMENT | CUTANEOUS | 0 refills | Status: DC | PRN

## 2018-05-06 MED ORDER — DIAZEPAM 10 MG PO TABS: ORAL_TABLET | ORAL | 1 refills | Status: DC

## 2018-05-06 NOTE — Progress Notes (Signed)
    GYNECOLOGY PROGRESS NOTE  Subjective:    Patient ID: Danielle Richardson, female    DOB: 03/07/1946, 73 y.o.   MRN: [id number]  HPI  Patient is a 73 y.o. [id number] female who presents for follow up of pelvic pain, dyspareunia, and vaginal trigger point.  Last visit in December, patient reported that the injection she received in November did offer significant pain relief (trigger point injection with lidocaine/sensorcaine/ betamethasone) in posterior fourchette.  She was also taking Gabapentin 300 mg daily (could not tolerate TID dosing).  Since that time, patient notes that her pain has returned.  Pain is intermittent, and appears to occur more on days when she is more active or doing more standing. She desires to know if anything else can be done as she is only supposed to received the injections quarterly, but is still having significant pain and discomfort on some days.   The following portions of the patient's history were reviewed and updated as appropriate: allergies, current medications, past family history, past medical history, past social history, past surgical history and problem list.  Review of Systems Pertinent items noted in HPI and remainder of comprehensive ROS otherwise negative.   Objective:   Blood pressure (!) 151/78, pulse 70, height 5\' 5"  (1.651 m), weight 186 lb 6.4 oz (84.6 kg). General appearance: alert and no distress, but tearful when discussing her pain Pelvis:  2-3 cm inside the introitus at approximately 3:00-4:00 is a palpable tender area, slightly nodular, that reproduces her pelvic pain.  Mild vaginal atrophy present. Also some tightening (band noted in posterior forchette on left side  Assessment:   1. Trigger point pain 2. Dyspareunia 3. Pelvic floor dysfunction 4. Mild vaginal atrophy  Plan:   -  Discussion had with patient regarding injections.  Patient notes that she is not opposed to the injections, however effects are short lived, and begins to  feel symptoms again after several weeks, varying in severity and timing. Discussed other options such as the use of lidocaine gel for comfort or vaginal diazepam for vaginal relaxation. Also discussed option of pelvic floor physical therapy, as it was noted that patient does have some tightening of the left side of her pelvic wall.  Patient notes that she would be willing to try each of these options. Will prescribe and refer to physical therapy.  - Patient to continue use of Premarin cream as previously prescribed for vaginal atrophy. DIscussed that she can use the applicator to also place the vaginal valium. To use the valium on days where she knows she will be more active.  - Patient to f/u in 2 months.  Will be due for next trigger point injection and can f/u on other modalities and reassess symptoms.     A total of 25 minutes were spent face-to-face with the patient during this encounter and over half of that time involved counseling and coordination of care.   [name], MD [facility]

## 2018-05-06 NOTE — Progress Notes (Signed)
Pt stated that the injection help a little. Pt stated that the first week after the injection then afterwards the pain was back and now the pain is off and on.

## 2018-05-07 NOTE — Addendum Note (Signed)
Addended by: Augusto Gamble on: 05/07/2018 04:05 PM   Modules accepted: Orders

## 2018-05-13 ENCOUNTER — Encounter: Payer: Medicare Other | Admitting: Obstetrics and Gynecology

## 2018-05-20 ENCOUNTER — Encounter: Payer: Self-pay | Admitting: Emergency Medicine

## 2018-05-21 ENCOUNTER — Inpatient Hospital Stay
Admission: EM | Admit: 2018-05-21 | Discharge: 2018-05-22 | DRG: 310 | Disposition: A | Discharge status: Home / Self Care | Payer: Medicare Other | Source: Ambulatory Visit | Attending: Internal Medicine | Admitting: Internal Medicine

## 2018-05-21 ENCOUNTER — Emergency Department: Payer: Medicare Other

## 2018-05-21 ENCOUNTER — Encounter: Payer: Self-pay | Admitting: Emergency Medicine

## 2018-05-21 ENCOUNTER — Other Ambulatory Visit: Payer: Self-pay

## 2018-05-21 ENCOUNTER — Ambulatory Visit
Admission: RE | Admit: 2018-05-21 | Discharge: 2018-05-21 | Disposition: A | Discharge status: Still In Hospital | Payer: Medicare Other | Source: Home / Self Care | Attending: Gastroenterology | Admitting: Gastroenterology

## 2018-05-21 ENCOUNTER — Ambulatory Visit: Payer: Medicare Other | Admitting: Certified Registered"

## 2018-05-21 ENCOUNTER — Encounter
Admission: RE | Disposition: A | Discharge status: Still In Hospital | Payer: Self-pay | Source: Home / Self Care | Attending: Gastroenterology

## 2018-05-21 ENCOUNTER — Encounter: Payer: Self-pay | Admitting: Student

## 2018-05-21 DIAGNOSIS — M1990 Unspecified osteoarthritis, unspecified site: Secondary | ICD-10-CM | POA: Diagnosis present

## 2018-05-21 DIAGNOSIS — Z91041 Radiographic dye allergy status: Secondary | ICD-10-CM

## 2018-05-21 DIAGNOSIS — Z87891 Personal history of nicotine dependence: Secondary | ICD-10-CM

## 2018-05-21 DIAGNOSIS — G43009 Migraine without aura, not intractable, without status migrainosus: Secondary | ICD-10-CM | POA: Diagnosis present

## 2018-05-21 DIAGNOSIS — Z800 Family history of malignant neoplasm of digestive organs: Secondary | ICD-10-CM

## 2018-05-21 DIAGNOSIS — Z7951 Long term (current) use of inhaled steroids: Secondary | ICD-10-CM | POA: Diagnosis not present

## 2018-05-21 DIAGNOSIS — I480 Paroxysmal atrial fibrillation: Secondary | ICD-10-CM | POA: Diagnosis not present

## 2018-05-21 DIAGNOSIS — I447 Left bundle-branch block, unspecified: Secondary | ICD-10-CM | POA: Diagnosis present

## 2018-05-21 DIAGNOSIS — I4891 Unspecified atrial fibrillation: Secondary | ICD-10-CM | POA: Diagnosis present

## 2018-05-21 DIAGNOSIS — Z8041 Family history of malignant neoplasm of ovary: Secondary | ICD-10-CM | POA: Diagnosis not present

## 2018-05-21 DIAGNOSIS — E669 Obesity, unspecified: Secondary | ICD-10-CM | POA: Diagnosis present

## 2018-05-21 DIAGNOSIS — K219 Gastro-esophageal reflux disease without esophagitis: Secondary | ICD-10-CM | POA: Diagnosis present

## 2018-05-21 DIAGNOSIS — M810 Age-related osteoporosis without current pathological fracture: Secondary | ICD-10-CM | POA: Diagnosis present

## 2018-05-21 DIAGNOSIS — I2510 Atherosclerotic heart disease of native coronary artery without angina pectoris: Secondary | ICD-10-CM | POA: Diagnosis present

## 2018-05-21 DIAGNOSIS — E785 Hyperlipidemia, unspecified: Secondary | ICD-10-CM | POA: Diagnosis present

## 2018-05-21 DIAGNOSIS — Z833 Family history of diabetes mellitus: Secondary | ICD-10-CM

## 2018-05-21 DIAGNOSIS — Z79891 Long term (current) use of opiate analgesic: Secondary | ICD-10-CM

## 2018-05-21 DIAGNOSIS — J449 Chronic obstructive pulmonary disease, unspecified: Secondary | ICD-10-CM | POA: Diagnosis present

## 2018-05-21 DIAGNOSIS — Z23 Encounter for immunization: Secondary | ICD-10-CM | POA: Diagnosis present

## 2018-05-21 DIAGNOSIS — Z86010 Personal history of colonic polyps: Secondary | ICD-10-CM | POA: Insufficient documentation

## 2018-05-21 DIAGNOSIS — I10 Essential (primary) hypertension: Secondary | ICD-10-CM | POA: Diagnosis present

## 2018-05-21 DIAGNOSIS — N3946 Mixed incontinence: Secondary | ICD-10-CM | POA: Diagnosis present

## 2018-05-21 DIAGNOSIS — Z885 Allergy status to narcotic agent status: Secondary | ICD-10-CM

## 2018-05-21 DIAGNOSIS — R000 Tachycardia, unspecified: Secondary | ICD-10-CM

## 2018-05-21 DIAGNOSIS — Z791 Long term (current) use of non-steroidal anti-inflammatories (NSAID): Secondary | ICD-10-CM | POA: Diagnosis not present

## 2018-05-21 DIAGNOSIS — Z79890 Hormone replacement therapy (postmenopausal): Secondary | ICD-10-CM

## 2018-05-21 DIAGNOSIS — Z79899 Other long term (current) drug therapy: Secondary | ICD-10-CM

## 2018-05-21 DIAGNOSIS — Z7982 Long term (current) use of aspirin: Secondary | ICD-10-CM | POA: Diagnosis not present

## 2018-05-21 DIAGNOSIS — R55 Syncope and collapse: Secondary | ICD-10-CM

## 2018-05-21 DIAGNOSIS — Z5309 Procedure and treatment not carried out because of other contraindication: Secondary | ICD-10-CM

## 2018-05-21 DIAGNOSIS — Z886 Allergy status to analgesic agent status: Secondary | ICD-10-CM

## 2018-05-21 DIAGNOSIS — J309 Allergic rhinitis, unspecified: Secondary | ICD-10-CM | POA: Diagnosis present

## 2018-05-21 DIAGNOSIS — Z09 Encounter for follow-up examination after completed treatment for conditions other than malignant neoplasm: Secondary | ICD-10-CM

## 2018-05-21 DIAGNOSIS — Z6829 Body mass index (BMI) 29.0-29.9, adult: Secondary | ICD-10-CM

## 2018-05-21 DIAGNOSIS — Z888 Allergy status to other drugs, medicaments and biological substances status: Secondary | ICD-10-CM

## 2018-05-21 DIAGNOSIS — Z880 Allergy status to penicillin: Secondary | ICD-10-CM

## 2018-05-21 LAB — CBC WITH DIFFERENTIAL/PLATELET
Abs Immature Granulocytes: 0.03 10*3/uL (ref 0.00–0.07)
Basophils Absolute: 0 10*3/uL (ref 0.0–0.1)
Basophils Relative: 0 %
EOS PCT: 1 %
Eosinophils Absolute: 0.1 10*3/uL (ref 0.0–0.5)
HCT: 44.8 % (ref 36.0–46.0)
Hemoglobin: 14.8 g/dL (ref 12.0–15.0)
Immature Granulocytes: 0 %
Lymphocytes Relative: 26 %
Lymphs Abs: 2.6 10*3/uL (ref 0.7–4.0)
MCH: 31 pg (ref 26.0–34.0)
MCHC: 33 g/dL (ref 30.0–36.0)
MCV: 93.9 fL (ref 80.0–100.0)
MONO ABS: 0.8 10*3/uL (ref 0.1–1.0)
Monocytes Relative: 8 %
Neutro Abs: 6.5 10*3/uL (ref 1.7–7.7)
Neutrophils Relative %: 65 %
Platelets: 265 10*3/uL (ref 150–400)
RBC: 4.77 MIL/uL (ref 3.87–5.11)
RDW: 12.8 % (ref 11.5–15.5)
WBC: 10 10*3/uL (ref 4.0–10.5)
nRBC: 0 % (ref 0.0–0.2)

## 2018-05-21 LAB — COMPREHENSIVE METABOLIC PANEL
ALT: 32 U/L (ref 0–44)
AST: 39 U/L (ref 15–41)
Albumin: 4.1 g/dL (ref 3.5–5.0)
Alkaline Phosphatase: 51 U/L (ref 38–126)
Anion gap: 12 (ref 5–15)
BILIRUBIN TOTAL: 1.1 mg/dL (ref 0.3–1.2)
BUN: 12 mg/dL (ref 8–23)
CO2: 20 mmol/L — ABNORMAL LOW (ref 22–32)
Calcium: 8.7 mg/dL — ABNORMAL LOW (ref 8.9–10.3)
Chloride: 109 mmol/L (ref 98–111)
Creatinine, Ser: 0.66 mg/dL (ref 0.44–1.00)
GFR calc Af Amer: >60 mL/min (ref >60)
GFR calc non Af Amer: >60 mL/min (ref >60)
Glucose, Bld: 123 mg/dL — ABNORMAL HIGH (ref 70–99)
Potassium: 4.4 mmol/L (ref 3.5–5.1)
Sodium: 141 mmol/L (ref 135–145)
TOTAL PROTEIN: 7.6 g/dL (ref 6.5–8.1)

## 2018-05-21 LAB — TROPONIN I: Troponin I: <0.03 ng/mL (ref <0.03)

## 2018-05-21 LAB — TSH
TSH: 3.226 u[IU]/mL (ref 0.350–4.500)
TSH: 2.883 u[IU]/mL (ref 0.350–4.500)

## 2018-05-21 SURGERY — COLONOSCOPY WITH PROPOFOL
Anesthesia: General

## 2018-05-21 MED ORDER — ASPIRIN EC 81 MG PO TBEC
81.0000 mg | DELAYED_RELEASE_TABLET | Freq: Every day | ORAL | Status: DC
  Administered 2018-05-21 – 2018-05-22 (×2): 81 mg via ORAL
  Filled 2018-05-21 (×2): qty 1

## 2018-05-21 MED ORDER — GABAPENTIN 300 MG PO CAPS
300.0000 mg | ORAL_CAPSULE | Freq: Three times a day (TID) | ORAL | Status: DC
  Filled 2018-05-21 (×2): qty 1

## 2018-05-21 MED ORDER — ROSUVASTATIN CALCIUM 5 MG PO TABS
5.0000 mg | ORAL_TABLET | Freq: Every day | ORAL | Status: DC
  Administered 2018-05-21 – 2018-05-22 (×2): 5 mg via ORAL
  Filled 2018-05-21 (×2): qty 1

## 2018-05-21 MED ORDER — METOPROLOL SUCCINATE ER 50 MG PO TB24
50.0000 mg | ORAL_TABLET | Freq: Every day | ORAL | Status: DC
  Administered 2018-05-22: 50 mg via ORAL
  Filled 2018-05-21: qty 1

## 2018-05-21 MED ORDER — PROPOFOL 10 MG/ML IV BOLUS
INTRAVENOUS | Status: AC
  Filled 2018-05-21: qty 40

## 2018-05-21 MED ORDER — ESMOLOL HCL 100 MG/10ML IV SOLN
INTRAVENOUS | Status: DC | PRN
  Administered 2018-05-21: 20 mg via INTRAVENOUS

## 2018-05-21 MED ORDER — POLYETHYLENE GLYCOL 3350 17 G PO PACK: 17.0000 g | PACK | Freq: Every day | ORAL | Status: DC | PRN

## 2018-05-21 MED ORDER — METOPROLOL TARTRATE 5 MG/5ML IV SOLN
INTRAVENOUS | Status: DC | PRN
  Administered 2018-05-21: 5 mg via INTRAVENOUS

## 2018-05-21 MED ORDER — ACETAMINOPHEN 650 MG RE SUPP: 650.0000 mg | Freq: Four times a day (QID) | RECTAL | Status: DC | PRN

## 2018-05-21 MED ORDER — CALCIUM CARBONATE-VITAMIN D 500-200 MG-UNIT PO TABS
1.0000 | ORAL_TABLET | Freq: Every day | ORAL | Status: DC
  Administered 2018-05-21 – 2018-05-22 (×2): 1 via ORAL
  Filled 2018-05-21 (×2): qty 1

## 2018-05-21 MED ORDER — METOPROLOL TARTRATE 5 MG/5ML IV SOLN
5.0000 mg | Freq: Once | INTRAVENOUS | Status: AC
  Administered 2018-05-21: 5 mg via INTRAVENOUS
  Filled 2018-05-21: qty 5

## 2018-05-21 MED ORDER — ONDANSETRON HCL 4 MG/2ML IJ SOLN: 4.0000 mg | Freq: Four times a day (QID) | INTRAMUSCULAR | Status: DC | PRN

## 2018-05-21 MED ORDER — DILTIAZEM HCL 25 MG/5ML IV SOLN: 5.0000 mg | Freq: Once | INTRAVENOUS | Status: DC

## 2018-05-21 MED ORDER — PNEUMOCOCCAL VAC POLYVALENT 25 MCG/0.5ML IJ INJ
0.5000 mL | INJECTION | INTRAMUSCULAR | Status: AC
  Administered 2018-05-22: 0.5 mL via INTRAMUSCULAR
  Filled 2018-05-21: qty 0.5

## 2018-05-21 MED ORDER — ONDANSETRON HCL 4 MG PO TABS: 4.0000 mg | ORAL_TABLET | Freq: Four times a day (QID) | ORAL | Status: DC | PRN

## 2018-05-21 MED ORDER — SODIUM CHLORIDE 0.9 % IV SOLN
INTRAVENOUS | Status: DC
  Administered 2018-05-21: 10:00:00 via INTRAVENOUS

## 2018-05-21 MED ORDER — ENOXAPARIN SODIUM 40 MG/0.4ML ~~LOC~~ SOLN
40.0000 mg | SUBCUTANEOUS | Status: DC
  Administered 2018-05-21: 40 mg via SUBCUTANEOUS
  Filled 2018-05-21: qty 0.4

## 2018-05-21 MED ORDER — PANTOPRAZOLE SODIUM 40 MG PO TBEC
40.0000 mg | DELAYED_RELEASE_TABLET | Freq: Two times a day (BID) | ORAL | Status: DC
  Administered 2018-05-21 – 2018-05-22 (×2): 40 mg via ORAL
  Filled 2018-05-21 (×2): qty 1

## 2018-05-21 MED ORDER — OXYBUTYNIN CHLORIDE 5 MG PO TABS
5.0000 mg | ORAL_TABLET | Freq: Every day | ORAL | Status: DC
  Administered 2018-05-21: 5 mg via ORAL
  Filled 2018-05-21: qty 1

## 2018-05-21 MED ORDER — MELOXICAM 7.5 MG PO TABS
7.5000 mg | ORAL_TABLET | Freq: Every day | ORAL | Status: DC
  Administered 2018-05-22: 7.5 mg via ORAL
  Filled 2018-05-21: qty 1

## 2018-05-21 MED ORDER — ACETAMINOPHEN 325 MG PO TABS: 650.0000 mg | ORAL_TABLET | Freq: Four times a day (QID) | ORAL | Status: DC | PRN

## 2018-05-21 MED ORDER — DILTIAZEM HCL-DEXTROSE 100-5 MG/100ML-% IV SOLN (PREMIX): 5.0000 mg/h | INTRAVENOUS | Status: DC

## 2018-05-21 MED ORDER — ADULT MULTIVITAMIN W/MINERALS CH
1.0000 | ORAL_TABLET | Freq: Every day | ORAL | Status: DC
  Administered 2018-05-21 – 2018-05-22 (×2): 1 via ORAL
  Filled 2018-05-21 (×2): qty 1

## 2018-05-21 NOTE — Progress Notes (Signed)
Family Meeting Note  Advance Directive:no  Today a meeting took place with the Patient.and spouse    The following clinical team members were present during this meeting:MD  The following were discussed:Patient's diagnosis: New onset atrial fibrillation, Patient's progosis: > 12 months and Goals for treatment: Full Code  Additional follow-up to be provided: chaplain consult to start advanced directives  Time spent during discussion:16 minutes  Kaiser Belluomini, MD

## 2018-05-21 NOTE — H&P (Signed)
[facility] [place] NAME: Danielle Richardson    MR#:  [id number]  DATE OF BIRTH:  01/11/1946  DATE OF ADMISSION:  05/21/2018  PRIMARY CARE PHYSICIAN: [name], MD   REQUESTING/REFERRING PHYSICIAN: *Dr. Rip Harbour CHIEF COMPLAINT:   Palpitations HISTORY OF PRESENT ILLNESS:  Danielle Richardson  is a 73 y.o. female with a known history of CAD who presents today to the emergency room with chief complaint of palpitations.  Patient was in the GI waiting room for a planned colonoscopy when she had sudden onset of palpitations and felt funny. She was found to have atrial fibrillation with RVR and was sent to ER for further evaluation.  In the emergency room her heart rate was up to the 150s.  She was noted to have atrial fibrillation.  She has been given diltiazem.  Her heart rate is better controlled today.  She reports no history of atrial fibrillation.  PAST MEDICAL HISTORY:   Past Medical History:  Diagnosis Date  . Arthritis   . Cataract   . Chronic obstructive pulmonary disease ([place]) 09/11/2013  . Coronary artery disease involving native coronary artery of native heart 09/23/2016   Overview:  Minimal by cath 2017  . GERD (gastroesophageal reflux disease)   . HLD (hyperlipidemia) 09/11/2013  . Joint pain   . Left bundle branch block   . Migraine   . Pelvic pain in female   . SUI (stress urinary incontinence, female)   . Vertigo   . Vertigo     PAST SURGICAL HISTORY:   Past Surgical History:  Procedure Laterality Date  . AUGMENTATION MAMMAPLASTY Bilateral    Pt had implants and then removed  . bladder tack  1999  . CARDIAC CATHETERIZATION N/A 09/12/2015   Procedure: Left Heart Cath and Coronary Angiography;  Surgeon: Corey Skains, MD;  Location: [facility];  Service: Cardiovascular;  Laterality: N/A;  . CATARACT EXTRACTION W/ INTRAOCULAR LENS  IMPLANT, BILATERAL  2018  . CHOLECYSTECTOMY  1994  . COLONOSCOPY WITH  PROPOFOL N/A 02/25/2017   Procedure: COLONOSCOPY WITH PROPOFOL;  Surgeon: Virgel Manifold, MD;  Location: [facility];  Service: Endoscopy;  Laterality: N/A;  . ESOPHAGOGASTRODUODENOSCOPY (EGD) WITH PROPOFOL N/A 02/25/2017   Procedure: ESOPHAGOGASTRODUODENOSCOPY (EGD) WITH PROPOFOL;  Surgeon: Virgel Manifold, MD;  Location: [facility];  Service: Endoscopy;  Laterality: N/A;  . EYE SURGERY    . HYSTEROSCOPY W/D&C N/A 01/08/2016   Procedure: DILATATION AND CURETTAGE /HYSTEROSCOPY;  Surgeon: Brayton Mars, MD;  Location: [facility];  Service: Gynecology;  Laterality: N/A;  . JOINT REPLACEMENT  2010   right shoulder  . NECK SURGERY  2010   Cervical Discectomy with fusion & plating  . SHOULDER SURGERY Right 2010   Total Shoulder Replacement, [facility]  . TOE SURGERY      SOCIAL HISTORY:   Social History   Tobacco Use  . Smoking status: Former Smoker    Last attempt to quit: 04/02/1999    Years since quitting: 19.1  . Smokeless tobacco: Never Used  Substance Use Topics  . Alcohol use: Not Currently    FAMILY HISTORY:   Family History  Problem Relation Age of Onset  . Diabetes Father   . Diabetes Brother   . Colon cancer Maternal Uncle   . Ovarian cancer Maternal Grandfather   . Breast cancer Neg Hx     DRUG ALLERGIES:   Allergies  Allergen Reactions  . Penicillins  Rash  . Ivp Dye [Iodinated Diagnostic Agents] Swelling  . Pravastatin Other (See Comments)    Body aches  . Alendronate Nausea Only  . Codeine Nausea Only  . Oseltamivir Rash  . Rofecoxib Swelling    Swelling of feet    REVIEW OF SYSTEMS:   Review of Systems  Constitutional: Negative.  Negative for chills, fever and malaise/fatigue.  HENT: Negative.  Negative for ear discharge, ear pain, hearing loss, nosebleeds and sore throat.   Eyes: Negative.  Negative for blurred vision and pain.  Respiratory: Negative.  Negative for cough, hemoptysis, shortness of breath and  wheezing.   Cardiovascular: Positive for palpitations. Negative for chest pain and leg swelling.  Gastrointestinal: Negative.  Negative for abdominal pain, blood in stool, diarrhea, nausea and vomiting.  Genitourinary: Negative.  Negative for dysuria.  Musculoskeletal: Negative.  Negative for back pain.  Skin: Negative.   Neurological: Negative for dizziness, tremors, speech change, focal weakness, seizures and headaches.  Endo/Heme/Allergies: Negative.  Does not bruise/bleed easily.  Psychiatric/Behavioral: Negative.  Negative for depression, hallucinations and suicidal ideas.    MEDICATIONS AT HOME:   Prior to Admission medications   Medication Sig Start Date End Date Taking? Authorizing Provider  albuterol (PROVENTIL HFA;VENTOLIN HFA) 108 (90 Base) MCG/ACT inhaler Inhale 2 puffs into the lungs every 6 (six) hours as needed for wheezing or shortness of breath.    [provider]  aspirin EC 81 MG tablet Take 81 mg by mouth daily.     [provider]  Calcium-Vitamin D-Vitamin K 500-100-40 MG-UNT-MCG CHEW Chew 1 Dose by mouth daily.    [provider]  clindamycin (CLEOCIN) 150 MG capsule Take 150 mg by mouth as needed. 4 tablets prior to dental appointment    [provider]  conjugated estrogens (PREMARIN) vaginal cream Place 9.98 Applicatorfuls vaginally 2 (two) times a week. 01/20/17   Defrancesco, [name], MD  diazepam (VALIUM) 10 MG tablet Use 1 tablet daily as needed for vaginal spasms. 05/06/18   [name], MD  fluticasone (FLONASE) 50 MCG/ACT nasal spray Place 2 sprays into the nose at bedtime.  03/28/15   [provider]  gabapentin (NEURONTIN) 300 MG capsule Take 300 mg by mouth 3 (three) times daily.    [provider]  lidocaine (XYLOCAINE) 5 % ointment Apply 1 application topically as needed. 05/06/18   [name], MD  meclizine (ANTIVERT) 25 MG tablet Take 25 mg by mouth 3 (three) times daily as needed for dizziness.     [provider]  meloxicam (MOBIC) 7.5 MG tablet Take 7.5 mg by mouth daily.  03/28/15   [provider]  metoprolol succinate (TOPROL-XL) 50 MG 24 hr tablet  08/19/17   [provider]  Multiple Vitamins-Minerals (MULTIVITAMIN GUMMIES ADULT PO) Take 1 Dose by mouth daily.    [provider]  oxybutynin (DITROPAN) 5 MG tablet Take 1 tablet (5 mg total) by mouth at bedtime. Patient taking differently: Take 5 mg by mouth at bedtime. Taking 1/2 tablet 01/21/18   Defrancesco, [name], MD  oxyCODONE (OXY IR/ROXICODONE) 5 MG immediate release tablet  10/27/17   [provider]  pantoprazole (PROTONIX) 40 MG tablet Take 40 mg by mouth 2 (two) times daily.  03/28/15   [provider]  polyethylene glycol powder (GLYCOLAX/MIRALAX) powder Take by mouth as needed.  09/14/13   [provider]  ranitidine (ZANTAC) 300 MG tablet Take 300 mg by mouth daily. 02/08/18   [provider]  rizatriptan (MAXALT) 10 MG tablet Take 10 mg by mouth as needed.     [provider]  rosuvastatin (CRESTOR) 5 MG tablet Take 5 mg by mouth daily.    [provider]      VITAL SIGNS:  Blood pressure (!) 154/74, pulse 92, temperature 98.5 F (36.9 C), temperature source Oral, resp. rate 14, height 5\' 5"  (1.651 m), weight 81.6 kg, SpO2 96 %.  PHYSICAL EXAMINATION:   Physical Exam Constitutional:      General: She is not in acute distress. HENT:     Head: Normocephalic.  Eyes:     General: No scleral icterus. Neck:     Musculoskeletal: Normal range of motion and neck supple.     Vascular: No JVD.     Trachea: No tracheal deviation.  Cardiovascular:     Rate and Rhythm: Normal rate and regular rhythm.     Heart sounds: Normal heart sounds. No murmur. No friction rub. No gallop.   Pulmonary:     Effort: Pulmonary effort is normal. No respiratory distress.     Breath sounds: Normal breath sounds. No wheezing or rales.  Chest:      Chest wall: No tenderness.  Abdominal:     General: Bowel sounds are normal. There is no distension.     Palpations: Abdomen is soft. There is no mass.     Tenderness: There is no abdominal tenderness. There is no guarding or rebound.  Musculoskeletal: Normal range of motion.  Skin:    General: Skin is warm.     Findings: No erythema or rash.  Neurological:     Mental Status: She is alert and oriented to person, place, and time.  Psychiatric:        Judgment: Judgment normal.       LABORATORY PANEL:   CBC Recent Labs  Lab 05/21/18 1118  WBC 10.0  HGB 14.8  HCT 44.8  PLT 265   ------------------------------------------------------------------------------------------------------------------  Chemistries  Recent Labs  Lab 05/21/18 1118  NA 141  K 4.4  CL 109  CO2 20*  GLUCOSE 123*  BUN 12  CREATININE 0.66  CALCIUM 8.7*  AST 39  ALT 32  ALKPHOS 51  BILITOT 1.1   ------------------------------------------------------------------------------------------------------------------  Cardiac Enzymes Recent Labs  Lab 05/21/18 1118  TROPONINI <0.03   ------------------------------------------------------------------------------------------------------------------  RADIOLOGY:  Dg Chest Portable 1 View  Result Date: 05/21/2018 CLINICAL DATA:  Irregular heart beat. EXAM: PORTABLE CHEST 1 VIEW COMPARISON:  None. FINDINGS: Mild cardiomegaly is noted. No pneumothorax or pleural effusion is noted. Both lungs are clear. Status post right shoulder arthroplasty. IMPRESSION: No active disease. Electronically Signed   By: Marijo Conception, M.D.   On: 05/21/2018 11:41    EKG:  Atrial fibrillation heart rate 140 no ST elevation or depression  IMPRESSION AND PLAN:   73 year old female with history of hypertension who was awaiting colonoscopy when she developed sudden onset of palpitations.  1.  New onset atrial fibrillation with RVR: Patient is now in normal sinus rhythm and  heart rate is better controlled. Echocardiogram ordered to evaluate valve function [facility] cardiology consultation placed via epic Check TSH Diltiazem drip if needed Continue home dose of metoprolol  2  Essential hypertension: Continue metoprolol for now  3.  CAD: Continue statin, metoprolol     All the records are reviewed and case discussed with ED provider. Management plans discussed with the patient and she is in agreement  CODE STATUS: full  TOTAL TIME TAKING CARE  OF THIS PATIENT: 45 minutes.    Zarra Geffert M.D on 05/21/2018 at 1:25 PM  Between 7am to 6pm - Pager - [phone]  After 6pm go to [email or web address] - password [email or web address] [facility]  Office  [phone]  CC: Primary care physician; [name], MD

## 2018-05-21 NOTE — H&P (Signed)
Danielle Antigua, MD [address] [place], [place], [place], [address] [address], [place], [place], [place], [address] Phone: [phone]  Fax: [phone]  Primary Care Physician:  [name], MD   Pre-Procedure History & Physical: HPI:  Danielle Richardson is a 73 y.o. female is here for a colonoscopy.   Past Medical History:  Diagnosis Date  . Arthritis   . Cataract   . Chronic obstructive pulmonary disease ([place]) 09/11/2013  . Coronary artery disease involving native coronary artery of native heart 09/23/2016   Overview:  Minimal by cath 2017  . GERD (gastroesophageal reflux disease)   . HLD (hyperlipidemia) 09/11/2013  . Joint pain   . Left bundle branch block   . Migraine   . Pelvic pain in female   . SUI (stress urinary incontinence, female)   . Vertigo   . Vertigo     Past Surgical History:  Procedure Laterality Date  . AUGMENTATION MAMMAPLASTY Bilateral    Pt had implants and then removed  . bladder tack  1999  . CARDIAC CATHETERIZATION N/A 09/12/2015   Procedure: Left Heart Cath and Coronary Angiography;  Surgeon: Corey Skains, MD;  Location: [facility];  Service: Cardiovascular;  Laterality: N/A;  . CATARACT EXTRACTION W/ INTRAOCULAR LENS  IMPLANT, BILATERAL  2018  . CHOLECYSTECTOMY  1994  . COLONOSCOPY WITH PROPOFOL N/A 02/25/2017   Procedure: COLONOSCOPY WITH PROPOFOL;  Surgeon: Virgel Manifold, MD;  Location: [facility];  Service: Endoscopy;  Laterality: N/A;  . ESOPHAGOGASTRODUODENOSCOPY (EGD) WITH PROPOFOL N/A 02/25/2017   Procedure: ESOPHAGOGASTRODUODENOSCOPY (EGD) WITH PROPOFOL;  Surgeon: Virgel Manifold, MD;  Location: [facility];  Service: Endoscopy;  Laterality: N/A;  . EYE SURGERY    . HYSTEROSCOPY W/D&C N/A 01/08/2016   Procedure: DILATATION AND CURETTAGE /HYSTEROSCOPY;  Surgeon: Brayton Mars, MD;  Location: [facility];  Service: Gynecology;  Laterality: N/A;  . JOINT REPLACEMENT  2010   right shoulder  . NECK SURGERY  2010   Cervical Discectomy with fusion & plating  . SHOULDER SURGERY Right 2010   Total Shoulder Replacement, [facility]  . TOE SURGERY      Prior to Admission medications   Medication Sig Start Date End Date Taking? Authorizing Provider  albuterol (PROVENTIL HFA;VENTOLIN HFA) 108 (90 Base) MCG/ACT inhaler Inhale 2 puffs into the lungs every 6 (six) hours as needed for wheezing or shortness of breath.   Yes [provider]  Calcium-Vitamin D-Vitamin K 500-100-40 MG-UNT-MCG CHEW Chew 1 Dose by mouth daily.   Yes [provider]  diazepam (VALIUM) 10 MG tablet Use 1 tablet daily as needed for vaginal spasms. 05/06/18  Yes [name], MD  meclizine (ANTIVERT) 25 MG tablet Take 25 mg by mouth 3 (three) times daily as needed for dizziness.   Yes [provider]  meloxicam (MOBIC) 7.5 MG tablet Take 7.5 mg by mouth daily.  03/28/15  Yes [provider]  metoprolol succinate (TOPROL-XL) 50 MG 24 hr tablet  08/19/17  Yes [provider]  Multiple Vitamins-Minerals (MULTIVITAMIN GUMMIES ADULT PO) Take 1 Dose by mouth daily.   Yes [provider]  oxybutynin (DITROPAN) 5 MG tablet Take 1 tablet (5 mg total) by mouth at bedtime. Patient taking differently: Take 5 mg by mouth at bedtime. Taking 1/2 tablet 01/21/18  Yes Defrancesco, [name], MD  pantoprazole (PROTONIX) 40 MG tablet Take 40 mg by mouth 2 (two) times daily.  03/28/15  Yes [provider]  rizatriptan (MAXALT) 10 MG tablet Take 10 mg by mouth as needed.    Yes [provider]  rosuvastatin (CRESTOR) 5 MG tablet Take 5 mg by mouth daily.   Yes [provider]  aspirin EC 81 MG tablet Take 81 mg by mouth daily.     [provider]  clindamycin (CLEOCIN) 150 MG capsule Take 150 mg by mouth as needed. 4 tablets prior to dental appointment    [provider]  conjugated estrogens (PREMARIN) vaginal cream Place  2.09 Applicatorfuls vaginally 2 (two) times a week. 01/20/17   Defrancesco, [name], MD  fluticasone (FLONASE) 50 MCG/ACT nasal spray Place 2 sprays into the nose at bedtime.  03/28/15   [provider]  gabapentin (NEURONTIN) 300 MG capsule Take 300 mg by mouth 3 (three) times daily.    [provider]  lidocaine (XYLOCAINE) 5 % ointment Apply 1 application topically as needed. 05/06/18   [name], MD  oxyCODONE (OXY IR/ROXICODONE) 5 MG immediate release tablet  10/27/17   [provider]  polyethylene glycol powder (GLYCOLAX/MIRALAX) powder Take by mouth as needed.  09/14/13   [provider]    Allergies as of 05/01/2018 - Review Complete 03/04/2018  Allergen Reaction Noted  . Penicillins Rash 04/25/2015  . Ivp dye [iodinated diagnostic agents] Swelling 01/21/2018  . Pravastatin Other (See Comments) 10/08/2016  . Alendronate Nausea Only 04/25/2015  . Codeine Nausea Only 04/25/2015  . Oseltamivir Rash 04/25/2015  . Rofecoxib Swelling 04/25/2015    Family History  Problem Relation Age of Onset  . Diabetes Father   . Diabetes Brother   . Colon cancer Maternal Uncle   . Ovarian cancer Maternal Grandfather   . Breast cancer Neg Hx     Social History   Socioeconomic History  . Marital status: Married    Spouse name: Not on file  . Number of children: Not on file  . Years of education: Not on file  . Highest education level: Not on file  Occupational History  . Not on file  Social Needs  . Financial resource strain: Not on file  . Food insecurity:    Worry: Not on file    Inability: Not on file  . Transportation needs:    Medical: Not on file    Non-medical: Not on file  Tobacco Use  . Smoking status: Former Smoker    Last attempt to quit: 04/02/1999    Years since quitting: 19.1  . Smokeless tobacco: Never Used  Substance and Sexual Activity  . Alcohol use: Not Currently  . Drug use: No  . Sexual activity: Not Currently    Birth  control/protection: Post-menopausal  Lifestyle  . Physical activity:    Days per week: 0 days    Minutes per session: 0 min  . Stress: Not on file  Relationships  . Social connections:    Talks on phone: Not on file    Gets together: Not on file    Attends religious service: Not on file    Active member of club or organization: Not on file    Attends meetings of clubs or organizations: Not on file    Relationship status: Not on file  . Intimate partner violence:    Fear of current or ex partner: Not on file    Emotionally abused: Not on file    Physically abused: Not on file    Forced sexual activity: Not on file  Other Topics Concern  . Not  on file  Social History Narrative  . Not on file    Review of Systems: See HPI, otherwise negative ROS  Physical Exam: BP 133/78   Pulse 89   Temp (!) 96.8 F (36 C) (Tympanic)   Resp 16   Ht 5\' 5"  (1.651 m)   Wt 84.5 kg   SpO2 99%   BMI 31.02 kg/m  General:   Alert,  pleasant and cooperative in NAD Head:  Normocephalic and atraumatic. Neck:  Supple; no masses or thyromegaly. Lungs:  Clear throughout to auscultation, normal respiratory effort.    Heart:  +S1, +S2, Regular rate and rhythm, No edema. Abdomen:  Soft, nontender and nondistended. Normal bowel sounds, without guarding, and without rebound.   Neurologic:  Alert and  oriented x4;  grossly normal neurologically.  Impression/Plan: Aida S [name] is here for a colonoscopy to be performed for polyp surveillance  Risks, benefits, limitations, and alternatives regarding  colonoscopy have been reviewed with the patient.  Questions have been answered.  All parties agreeable.   Virgel Manifold, MD  05/21/2018, 9:33 AM

## 2018-05-21 NOTE — Care Management Note (Signed)
Case Management Note  Patient Details  Name: Danielle Richardson MRN: [id number] Date of Birth: 17-Nov-1945  Subjective/Objective:    Patient is being seen in the ED for irregular heart beat.  Patient was scheduled to have a colonoscopy today and was in the pre op area when her heart started beating funny.  Patient did not have her procedure and was sent to the ED for evaluation.  Patient will be admitted on Telemetry.  Patient reports that she is from home with husband, they live in [place].  Husband is a English as a second language teacher and has his care at the [place], patient has insurance with the [place] through her husband.  Patient also has [employer].  Patient is independent, requires no assistive devices and drives.  PCP is Dr. Doy Hutching and she has mail order prescriptions with [facility].  No discharge needs identified at this time.   Doran Clay RN BSN Care Manager [phone]              Action/Plan:   Expected Discharge Date:                  Expected Discharge Plan:  Home/Self Care  In-House Referral:     Discharge planning Services     Post Acute Care Choice:    Choice offered to:     DME Arranged:    DME Agency:     HH Arranged:    HH Agency:     Status of Service:  In process, will continue to follow  If discussed at Long Length of Stay Meetings, dates discussed:    Additional Comments:  Shelbie Hutching, RN 05/21/2018, 3:28 PM

## 2018-05-21 NOTE — Consult Note (Signed)
Reason for Consult: Atrial fibrillation rapid ventricular response Referring Physician: Dr. Benjie Karvonen hospitalist Cardiologist Dr. Kowalski  Danielle Richardson is an 73 y.o. female.  HPI: Patient was preop for colonoscopy after bowel prep the patient was found to have rapid atrial fibrillation when she was waiting to have the procedure done.  Patient was treated with diltiazem which subsequently helped her converted to sinus rhythm patient denied any significant chest pain denies any blackout spells or syncope no previous cardiac history except for abnormal EKG with left bundle branch block.  Now here for further cardiac assessment  Past Medical History:  Diagnosis Date  . Arthritis   . Cataract   . Chronic obstructive pulmonary disease ([place]) 09/11/2013  . Coronary artery disease involving native coronary artery of native heart 09/23/2016   Overview:  Minimal by cath 2017  . GERD (gastroesophageal reflux disease)   . HLD (hyperlipidemia) 09/11/2013  . Joint pain   . Left bundle branch block   . Migraine   . Pelvic pain in female   . SUI (stress urinary incontinence, female)   . Vertigo   . Vertigo     Past Surgical History:  Procedure Laterality Date  . AUGMENTATION MAMMAPLASTY Bilateral    Pt had implants and then removed  . bladder tack  1999  . CARDIAC CATHETERIZATION N/A 09/12/2015   Procedure: Left Heart Cath and Coronary Angiography;  Surgeon: Corey Skains, MD;  Location: [facility];  Service: Cardiovascular;  Laterality: N/A;  . CATARACT EXTRACTION W/ INTRAOCULAR LENS  IMPLANT, BILATERAL  2018  . CHOLECYSTECTOMY  1994  . COLONOSCOPY WITH PROPOFOL N/A 02/25/2017   Procedure: COLONOSCOPY WITH PROPOFOL;  Surgeon: Virgel Manifold, MD;  Location: [facility];  Service: Endoscopy;  Laterality: N/A;  . ESOPHAGOGASTRODUODENOSCOPY (EGD) WITH PROPOFOL N/A 02/25/2017   Procedure: ESOPHAGOGASTRODUODENOSCOPY (EGD) WITH PROPOFOL;  Surgeon: Virgel Manifold, MD;   Location: [facility];  Service: Endoscopy;  Laterality: N/A;  . EYE SURGERY    . HYSTEROSCOPY W/D&C N/A 01/08/2016   Procedure: DILATATION AND CURETTAGE /HYSTEROSCOPY;  Surgeon: Brayton Mars, MD;  Location: [facility];  Service: Gynecology;  Laterality: N/A;  . JOINT REPLACEMENT  2010   right shoulder  . NECK SURGERY  2010   Cervical Discectomy with fusion & plating  . SHOULDER SURGERY Right 2010   Total Shoulder Replacement, [facility]  . TOE SURGERY      Family History  Problem Relation Age of Onset  . Diabetes Father   . Diabetes Brother   . Colon cancer Maternal Uncle   . Ovarian cancer Maternal Grandfather   . Breast cancer Neg Hx     Social History:  reports that she quit smoking about 19 years ago. She has never used smokeless tobacco. She reports previous alcohol use. She reports that she does not use drugs.  Allergies:  Allergies  Allergen Reactions  . Penicillins Rash  . Ivp Dye [Iodinated Diagnostic Agents] Swelling  . Pravastatin Other (See Comments)    Body aches  . Alendronate Nausea Only  . Codeine Nausea Only  . Oseltamivir Rash  . Rofecoxib Swelling    Swelling of feet    Medications: I have reviewed the patient's current medications.  Results for orders placed or performed during the hospital encounter of 05/21/18 (from the past 48 hour(s))  Troponin I - Once     Status: None   Collection Time: 05/21/18 11:18 AM  Result Value Ref Range   Troponin  I <0.03 <0.03 ng/mL    Comment: Performed at [facility], [address] [place], [place] [address]  Comprehensive metabolic panel     Status: Abnormal   Collection Time: 05/21/18 11:18 AM  Result Value Ref Range   Sodium 141 135 - 145 mmol/L   Potassium 4.4 3.5 - 5.1 mmol/L    Comment: HEMOLYSIS AT THIS LEVEL MAY AFFECT RESULT   Chloride 109 98 - 111 mmol/L   CO2 20 (L) 22 - 32 mmol/L   Glucose, Bld 123 (H) 70 - 99 mg/dL   BUN 12 8 - 23 mg/dL   Creatinine, Ser 0.66 0.44  - 1.00 mg/dL   Calcium 8.7 (L) 8.9 - 10.3 mg/dL   Total Protein 7.6 6.5 - 8.1 g/dL   Albumin 4.1 3.5 - 5.0 g/dL   AST 39 15 - 41 U/L   ALT 32 0 - 44 U/L   Alkaline Phosphatase 51 38 - 126 U/L   Total Bilirubin 1.1 0.3 - 1.2 mg/dL   GFR calc non Af Amer >60 >60 mL/min   GFR calc Af Amer >60 >60 mL/min   Anion gap 12 5 - 15    Comment: Performed at [facility], [address] [place], [place] [address]  CBC with Differential     Status: None   Collection Time: 05/21/18 11:18 AM  Result Value Ref Range   WBC 10.0 4.0 - 10.5 K/uL   RBC 4.77 3.87 - 5.11 MIL/uL   Hemoglobin 14.8 12.0 - 15.0 g/dL   HCT 44.8 36.0 - 46.0 %   MCV 93.9 80.0 - 100.0 fL   MCH 31.0 26.0 - 34.0 pg   MCHC 33.0 30.0 - 36.0 g/dL   RDW 12.8 11.5 - 15.5 %   Platelets 265 150 - 400 K/uL   nRBC 0.0 0.0 - 0.2 %   Neutrophils Relative % 65 %   Neutro Abs 6.5 1.7 - 7.7 K/uL   Lymphocytes Relative 26 %   Lymphs Abs 2.6 0.7 - 4.0 K/uL   Monocytes Relative 8 %   Monocytes Absolute 0.8 0.1 - 1.0 K/uL   Eosinophils Relative 1 %   Eosinophils Absolute 0.1 0.0 - 0.5 K/uL   Basophils Relative 0 %   Basophils Absolute 0.0 0.0 - 0.1 K/uL   Immature Granulocytes 0 %   Abs Immature Granulocytes 0.03 0.00 - 0.07 K/uL    Comment: Performed at [facility], [address] [place], [place] [address]  TSH     Status: None   Collection Time: 05/21/18 11:18 AM  Result Value Ref Range   TSH 3.226 0.350 - 4.500 uIU/mL    Comment: Performed by a 3rd Generation assay with a functional sensitivity of <=0.01 uIU/mL. Performed at [facility], [address], [place], [place] [address]     Dg Chest Portable 1 View  Result Date: 05/21/2018 CLINICAL DATA:  Irregular heart beat. EXAM: PORTABLE CHEST 1 VIEW COMPARISON:  None. FINDINGS: Mild cardiomegaly is noted. No pneumothorax or pleural effusion is noted. Both lungs are clear. Status post right shoulder arthroplasty. IMPRESSION: No active disease.  Electronically Signed   By: Marijo Conception, M.D.   On: 05/21/2018 11:41    Review of Systems  Constitutional: Positive for malaise/fatigue.  HENT: Negative.   Eyes: Negative.   Respiratory: Negative.   Cardiovascular: Positive for palpitations.  Gastrointestinal: Negative.   Genitourinary: Negative.   Musculoskeletal: Negative.   Skin: Negative.    Blood pressure  112/63, pulse 73, temperature 98.5 F (36.9 C), temperature source Oral, resp. rate (!) 24, height 5\' 5"  (1.651 m), weight 80.8 kg, SpO2 94 %. Physical Exam  Nursing note and vitals reviewed. Constitutional: She is oriented to person, place, and time. She appears well-developed and well-nourished.  HENT:  Head: Normocephalic and atraumatic.  Eyes: Pupils are equal, round, and reactive to light. Conjunctivae and EOM are normal.  Neck: Normal range of motion. Neck supple.  Cardiovascular: Normal rate, regular rhythm and normal heart sounds.  Respiratory: Effort normal and breath sounds normal.  GI: Soft. Bowel sounds are normal.  Musculoskeletal: Normal range of motion.  Neurological: She is alert and oriented to person, place, and time. She has normal reflexes.  Skin: Skin is warm and dry.  Psychiatric: She has a normal mood and affect.    Assessment/Plan: Atrial fibrillation paroxysmal Palpitations Borderline obesity Preop colonoscopy COPD Coronary artery disease mild GERD Abnormal EKG left bundle branch block Vertigo Hyperlipidemia . Plan Status post atrial fibrillation now sinus Recommend continue low-dose Cardizem therapy Consider aspirin therapy We will defer anticoagulation for now Patient should be an adequate risk for colonoscopy if rate controlled Consider increasing Toprol 100 mg a day Agree with inhaler therapy for COPD Continue Crestor therapy for lipid management Consider limited outpatient cardiac evaluation including echocardiogram Mali vas score is 3 which would suggest anticoagulation  should be strongly considered Episode may be related to bowel prep for colonoscopy Recommend increase Toprol consider Cardizem as needed as needed for episodic episodes consider Eliquis twice a day  Suman Trivedi D Kais Monje 05/21/2018, 5:21 PM

## 2018-05-21 NOTE — Transfer of Care (Signed)
Immediate Anesthesia Transfer of Care Note  Patient: Danielle Richardson  Procedure(s) Performed: COLONOSCOPY WITH PROPOFOL (N/A )  Patient Location: Endoscopy Unit  Anesthesia Type:case cancelled  Level of Consciousness: awake, alert  and oriented  Airway & Oxygen Therapy: Patient Spontanous Breathing and Patient connected to nasal cannula oxygen  Post-op Assessment: Report given to RN and Post -op Vital signs reviewed and stable  Post vital signs: Reviewed and stable  Last Vitals:  Vitals Value Taken Time  BP    Temp    Pulse    Resp    SpO2      Last Pain:  Vitals:   05/21/18 0909  TempSrc: Tympanic  PainSc: 0-No pain         Complications: No apparent anesthesia complications

## 2018-05-21 NOTE — Anesthesia Post-op Follow-up Note (Signed)
Anesthesia QCDR form completed.        

## 2018-05-21 NOTE — Progress Notes (Signed)
Patient discharged from ENDO due to Rapid Afib, HR 100s-201.  Transported to ED via stretcher while monitored.  IV remains in place.  Report given to Tiffany, Therapist, sports (triage RN).  Patient transferred into wheelchair and placed in Triage Room.  Family aware and waiting in ED waiting room.

## 2018-05-21 NOTE — Anesthesia Preprocedure Evaluation (Addendum)
Anesthesia Evaluation  Patient identified by MRN, date of birth, ID band Patient awake    Reviewed: Allergy & Precautions, H&P , NPO status , Patient's Chart, lab work & pertinent test results, reviewed documented beta blocker date and time   Airway Mallampati: II   Neck ROM: full    Dental  (+) Poor Dentition   Pulmonary neg pulmonary ROS, COPD, former smoker,    Pulmonary exam normal        Cardiovascular Exercise Tolerance: Poor hypertension, On Medications + angina with exertion + CAD  negative cardio ROS Normal cardiovascular exam+ dysrhythmias Atrial Fibrillation  Rhythm:regular Rate:Normal     Neuro/Psych  Headaches, negative neurological ROS  negative psych ROS   GI/Hepatic negative GI ROS, Neg liver ROS, GERD  Medicated,  Endo/Other  negative endocrine ROS  Renal/GU negative Renal ROS  negative genitourinary   Musculoskeletal   Abdominal   Peds  Hematology negative hematology ROS (+)   Anesthesia Other Findings Past Medical History: No date: Arthritis No date: Cataract 09/11/2013: Chronic obstructive pulmonary disease ([place]) 09/23/2016: Coronary artery disease involving native coronary artery  of native heart     Comment:  Overview:  Minimal by cath 2017 No date: GERD (gastroesophageal reflux disease) 09/11/2013: HLD (hyperlipidemia) No date: Joint pain No date: Left bundle branch block No date: Migraine No date: Pelvic pain in female No date: SUI (stress urinary incontinence, female) No date: Vertigo No date: Vertigo Past Surgical History: No date: AUGMENTATION MAMMAPLASTY; Bilateral     Comment:  Pt had implants and then removed 1999: bladder tack 09/12/2015: CARDIAC CATHETERIZATION; N/A     Comment:  Procedure: Left Heart Cath and Coronary Angiography;                Surgeon: Corey Skains, MD;  Location: [facility];  Service: Cardiovascular;  Laterality: N/A; 2018:  CATARACT EXTRACTION W/ INTRAOCULAR LENS  IMPLANT, BILATERAL 1994: CHOLECYSTECTOMY 02/25/2017: COLONOSCOPY WITH PROPOFOL; N/A     Comment:  Procedure: COLONOSCOPY WITH PROPOFOL;  Surgeon:               Virgel Manifold, MD;  Location: [place];              Service: Endoscopy;  Laterality: N/A; 02/25/2017: ESOPHAGOGASTRODUODENOSCOPY (EGD) WITH PROPOFOL; N/A     Comment:  Procedure: ESOPHAGOGASTRODUODENOSCOPY (EGD) WITH               PROPOFOL;  Surgeon: Virgel Manifold, MD;  Location:               [place];  Service: Endoscopy;  Laterality:               N/A; No date: EYE SURGERY 01/08/2016: HYSTEROSCOPY W/D&C; N/A     Comment:  Procedure: DILATATION AND CURETTAGE /HYSTEROSCOPY;                Surgeon: Brayton Mars, MD;  Location: [facility];                Service: Gynecology;  Laterality: N/A; 2010: JOINT REPLACEMENT     Comment:  right shoulder 2010: NECK SURGERY     Comment:  Cervical Discectomy with fusion & plating 2010: SHOULDER SURGERY; Right     Comment:  Total Shoulder Replacement, [facility] No date: TOE SURGERY   Reproductive/Obstetrics negative OB ROS  Anesthesia Physical Anesthesia Plan  ASA: III  Anesthesia Plan: General   Post-op Pain Management:    Induction:   PONV Risk Score and Plan:   Airway Management Planned:   Additional Equipment:   Intra-op Plan:   Post-operative Plan:   Informed Consent: I have reviewed the patients History and Physical, chart, labs and discussed the procedure including the risks, benefits and alternatives for the proposed anesthesia with the patient or authorized representative who has indicated his/her understanding and acceptance.     Dental Advisory Given  Plan Discussed with: CRNA  Anesthesia Plan Comments:         Anesthesia Quick Evaluation

## 2018-05-21 NOTE — ED Provider Notes (Addendum)
[facility] Emergency Department Provider Note   ____________________________________________   First MD Initiated Contact with Patient 05/21/18 1104     (approximate)  I have reviewed the triage vital signs and the nursing notes.   HISTORY  Chief Complaint Irregular Heart Beat    HPI Danielle Richardson is a 73 y.o. female reports a history of intermittent episodes of palpitation in the past but has only had a diagnosis of left bundle branch block.  Today she was sitting down in a chair prior to getting her colonoscopy and felt her heart racing and got weak and dizzy and almost passed out.  She  did not have any chest tightness or shortness of breath just the palpitations a week and dizziness.   Past Medical History:  Diagnosis Date  . Arthritis   . Cataract   . Chronic obstructive pulmonary disease ([place]) 09/11/2013  . Coronary artery disease involving native coronary artery of native heart 09/23/2016   Overview:  Minimal by cath 2017  . GERD (gastroesophageal reflux disease)   . HLD (hyperlipidemia) 09/11/2013  . Joint pain   . Left bundle branch block   . Migraine   . Pelvic pain in female   . SUI (stress urinary incontinence, female)   . Vertigo   . Vertigo     Patient Active Problem List   Diagnosis Date Noted  . Trigger point, vagina 02/11/2018  . Benign essential HTN 09/23/2016  . Coronary artery disease involving native coronary artery of native heart 09/23/2016  . Unstable angina ([place]) 08/31/2015  . Complete left bundle branch block 08/31/2015  . Endometrial polyp 06/01/2015  . Uterine fibroid 05/23/2015  . Gastric catarrh 04/25/2015  . Climacteric 04/25/2015  . Chest pain, non-cardiac 04/25/2015  . Arthritis, degenerative 04/25/2015  . Mixed incontinence urge and stress 04/25/2015  . Dyspareunia, female 04/25/2015  . Acute frontal sinusitis 03/30/2014  . Other synovitis and tenosynovitis, right shoulder 03/30/2014  . History of  artificial joint 03/30/2014  . Right supraspinatus tenosynovitis 03/30/2014  . Common migraine with intractable migraine 10/19/2013  . Allergic rhinitis 09/11/2013  . Chronic obstructive pulmonary disease ([place]) 09/11/2013  . Acid reflux 09/11/2013  . BP (high blood pressure) 09/11/2013  . History of migraine headaches 09/11/2013  . HLD (hyperlipidemia) 09/11/2013  . OP (osteoporosis) 09/11/2013  . Awareness of heartbeats 09/11/2013    Past Surgical History:  Procedure Laterality Date  . AUGMENTATION MAMMAPLASTY Bilateral    Pt had implants and then removed  . bladder tack  1999  . CARDIAC CATHETERIZATION N/A 09/12/2015   Procedure: Left Heart Cath and Coronary Angiography;  Surgeon: Corey Skains, MD;  Location: [place] [facility];  Service: Cardiovascular;  Laterality: N/A;  . CATARACT EXTRACTION W/ INTRAOCULAR LENS  IMPLANT, BILATERAL  2018  . CHOLECYSTECTOMY  1994  . COLONOSCOPY WITH PROPOFOL N/A 02/25/2017   Procedure: COLONOSCOPY WITH PROPOFOL;  Surgeon: Virgel Manifold, MD;  Location: [place];  Service: Endoscopy;  Laterality: N/A;  . ESOPHAGOGASTRODUODENOSCOPY (EGD) WITH PROPOFOL N/A 02/25/2017   Procedure: ESOPHAGOGASTRODUODENOSCOPY (EGD) WITH PROPOFOL;  Surgeon: Virgel Manifold, MD;  Location: [place];  Service: Endoscopy;  Laterality: N/A;  . EYE SURGERY    . HYSTEROSCOPY W/D&C N/A 01/08/2016   Procedure: DILATATION AND CURETTAGE /HYSTEROSCOPY;  Surgeon: Brayton Mars, MD;  Location: [facility];  Service: Gynecology;  Laterality: N/A;  . JOINT REPLACEMENT  2010   right shoulder  . NECK SURGERY  2010  Cervical Discectomy with fusion & plating  . SHOULDER SURGERY Right 2010   Total Shoulder Replacement, [facility]  . TOE SURGERY      Prior to Admission medications   Medication Sig Start Date End Date Taking? Authorizing Provider  albuterol (PROVENTIL HFA;VENTOLIN HFA) 108 (90 Base) MCG/ACT inhaler Inhale 2 puffs into the  lungs every 6 (six) hours as needed for wheezing or shortness of breath.    [provider]  aspirin EC 81 MG tablet Take 81 mg by mouth daily.     [provider]  Calcium-Vitamin D-Vitamin K 500-100-40 MG-UNT-MCG CHEW Chew 1 Dose by mouth daily.    [provider]  clindamycin (CLEOCIN) 150 MG capsule Take 150 mg by mouth as needed. 4 tablets prior to dental appointment    [provider]  conjugated estrogens (PREMARIN) vaginal cream Place 1.61 Applicatorfuls vaginally 2 (two) times a week. 01/20/17   Defrancesco, [name], MD  diazepam (VALIUM) 10 MG tablet Use 1 tablet daily as needed for vaginal spasms. 05/06/18   [name], MD  fluticasone (FLONASE) 50 MCG/ACT nasal spray Place 2 sprays into the nose at bedtime.  03/28/15   [provider]  gabapentin (NEURONTIN) 300 MG capsule Take 300 mg by mouth 3 (three) times daily.    [provider]  lidocaine (XYLOCAINE) 5 % ointment Apply 1 application topically as needed. 05/06/18   [name], MD  meclizine (ANTIVERT) 25 MG tablet Take 25 mg by mouth 3 (three) times daily as needed for dizziness.    [provider]  meloxicam (MOBIC) 7.5 MG tablet Take 7.5 mg by mouth daily.  03/28/15   [provider]  metoprolol succinate (TOPROL-XL) 50 MG 24 hr tablet  08/19/17   [provider]  Multiple Vitamins-Minerals (MULTIVITAMIN GUMMIES ADULT PO) Take 1 Dose by mouth daily.    [provider]  oxybutynin (DITROPAN) 5 MG tablet Take 1 tablet (5 mg total) by mouth at bedtime. Patient taking differently: Take 5 mg by mouth at bedtime. Taking 1/2 tablet 01/21/18   Defrancesco, [name], MD  oxyCODONE (OXY IR/ROXICODONE) 5 MG immediate release tablet  10/27/17   [provider]  pantoprazole (PROTONIX) 40 MG tablet Take 40 mg by mouth 2 (two) times daily.  03/28/15   [provider]  polyethylene glycol powder (GLYCOLAX/MIRALAX) powder Take by mouth as  needed.  09/14/13   [provider]  ranitidine (ZANTAC) 300 MG tablet Take 300 mg by mouth daily. 02/08/18   [provider]  rizatriptan (MAXALT) 10 MG tablet Take 10 mg by mouth as needed.     [provider]  rosuvastatin (CRESTOR) 5 MG tablet Take 5 mg by mouth daily.    [provider]    Allergies Penicillins; Ivp dye [iodinated diagnostic agents]; Pravastatin; Alendronate; Codeine; Oseltamivir; and Rofecoxib  Family History  Problem Relation Age of Onset  . Diabetes Father   . Diabetes Brother   . Colon cancer Maternal Uncle   . Ovarian cancer Maternal Grandfather   . Breast cancer Neg Hx     Social History Social History   Tobacco Use  . Smoking status: Former Smoker    Last attempt to quit: 04/02/1999    Years since quitting: 19.1  . Smokeless tobacco: Never Used  Substance Use Topics  . Alcohol use: Not Currently  . Drug use: No    Review of Systems  Constitutional: No fever/chills Eyes: No visual changes. ENT: No sore throat.  Cardiovascular: Denies chest pain. Respiratory: Denies shortness of breath. Gastrointestinal: No abdominal pain.  No nausea, no vomiting.  No diarrhea.  No constipation. Genitourinary: Negative for dysuria. Musculoskeletal: Negative for back pain. Skin: Negative for rash. Neurological: Negative for headaches, focal weakness  ____________________________________________   PHYSICAL EXAM:  VITAL SIGNS: ED Triage Vitals [05/21/18 1054]  Enc Vitals Group     BP (!) 141/105     Pulse Rate 87     Resp 20     Temp 98.5 F (36.9 C)     Temp Source Oral     SpO2 93 %     Weight 180 lb (81.6 kg)     Height 5\' 5"  (1.651 m)     Head Circumference      Peak Flow      Pain Score 8     Pain Loc      Pain Edu?      Excl. in [place]?     Constitutional: Alert and oriented. Well appearing and in no acute distress. Eyes: Conjunctivae are normal.  Head: Atraumatic. Nose: No  congestion/rhinnorhea. Mouth/Throat: Mucous membranes are moist.  Oropharynx non-erythematous. Neck: No stridor.   Cardiovascular: Normal rate, regular rhythm. Grossly normal heart sounds.  Good peripheral circulation. Respiratory: Normal respiratory effort.  No retractions. Lungs CTAB. Gastrointestinal: Soft and nontender. No distention. No abdominal bruits. No CVA tenderness. Musculoskeletal: No lower extremity tenderness nor edema.   Neurologic:  Normal speech and language. No gross focal neurologic deficits are appreciated.  Skin:  Skin is warm, dry and intact. No rash noted.  ____________________________________________   LABS (all labs ordered are listed, but only abnormal results are displayed)  Labs Reviewed  COMPREHENSIVE METABOLIC PANEL - Abnormal; Notable for the following components:      Result Value   CO2 20 (*)    Glucose, Bld 123 (*)    Calcium 8.7 (*)    All other components within normal limits  TROPONIN I  CBC WITH DIFFERENTIAL/PLATELET  TSH   ____________________________________________  EKG  EKG read interpreted by me shows A. fib at a rate of 140 with left axis no acute ST-T wave changes patient has no history of A. fib in the past _Monitor shows A. fib with RVR heart rate as high as 190 bpm ___________________________________________  RADIOLOGY  ED MD interpretation: X-ray read by radiology reviewed by me shows no acute disease  Official radiology report(s): Dg Chest Portable 1 View  Result Date: 05/21/2018 CLINICAL DATA:  Irregular heart beat. EXAM: PORTABLE CHEST 1 VIEW COMPARISON:  None. FINDINGS: Mild cardiomegaly is noted. No pneumothorax or pleural effusion is noted. Both lungs are clear. Status post right shoulder arthroplasty. IMPRESSION: No active disease. Electronically Signed   By: Marijo Conception, M.D.   On: 05/21/2018 11:41    ____________________________________________   PROCEDURES  Procedure(s) performed:    Procedures  Critical Care performed:   ____________________________________________   INITIAL IMPRESSION / ASSESSMENT AND PLAN / ED COURSE  Patient with A. fib RVR got esmolol and metoprolol had colonoscopy.  She came here and got another dose of metoprolol heart rate is still going up to 190.  Blood pressure still high.  We will try a little bit of diltiazem and put her on a drip and get her in the hospital.  She is almost passed out here as well as at colonoscopy.   Patient now has converted to normal sinus rhythm at a rate of 76 normal axis left bundle branch block  no acute changes waiting for cardiology to call me back.      ____________________________________________   FINAL CLINICAL IMPRESSION(S) / ED DIAGNOSES  Final diagnoses:  Atrial fibrillation with RVR ([place])  Near syncope     ED Discharge Orders    None       Note:  This document was prepared using Dragon voice recognition software and may include unintentional dictation errors.    Nena Polio, MD 05/21/18 1313    Nena Polio, MD 05/21/18 [phone]

## 2018-05-21 NOTE — Progress Notes (Signed)
Before the patient received any sedation for this procedure any medications for the procedure, her heart rate was noted to be elevated.  Heart rate went to the 180s to 190s.  Patient did not have any chest pain during these episodes.  She did state that this morning before coming to the hospital she may have had some chest pain but thought it was palpitations.  She does not have any history of A. fib.  Metoprolol IV was given by anesthesia and heart rate decreased to the 80s but then increased again to 160s.  Therefore decision was made to send the patient to the ER and patient was agreeable with the plan.

## 2018-05-21 NOTE — ED Notes (Signed)
Pt ambulated to toilet ( step and pivot) with no problems. When placed back on monitor pt is in normal rhythm. 2nd ekg obtained and given to dr. Randa Evens on cardizem - cardizem cancelled for now.

## 2018-05-21 NOTE — Progress Notes (Signed)
   05/21/18 1600  Clinical Encounter Type  Visited With Patient and family together  Visit Type Initial  Referral From Physician   OR received to complete or update an AD. Patient in bed, alert and oriented. Family at the bedside (husband, friend, and daughter). Patient informed about the AD and not interested in education or reviewing paperwork. Chaplain engaged patient in conversation about her family and church life. Will call or page if needs arise.

## 2018-05-21 NOTE — ED Triage Notes (Signed)
Pt reports went for a colonoscopy and when she stood she felt light headed and like her heart was beating funny

## 2018-05-22 LAB — BASIC METABOLIC PANEL
Anion gap: 10 (ref 5–15)
BUN: 19 mg/dL (ref 8–23)
CO2: 24 mmol/L (ref 22–32)
Calcium: 8.5 mg/dL — ABNORMAL LOW (ref 8.9–10.3)
Chloride: 106 mmol/L (ref 98–111)
Creatinine, Ser: 0.68 mg/dL (ref 0.44–1.00)
GFR calc Af Amer: >60 mL/min (ref >60)
GFR calc non Af Amer: >60 mL/min (ref >60)
GLUCOSE: 123 mg/dL — AB (ref 70–99)
Potassium: 3.7 mmol/L (ref 3.5–5.1)
Sodium: 140 mmol/L (ref 135–145)

## 2018-05-22 LAB — CBC
HCT: 42 % (ref 36.0–46.0)
Hemoglobin: 13.6 g/dL (ref 12.0–15.0)
MCH: 31.1 pg (ref 26.0–34.0)
MCHC: 32.4 g/dL (ref 30.0–36.0)
MCV: 95.9 fL (ref 80.0–100.0)
Platelets: 266 10*3/uL (ref 150–400)
RBC: 4.38 MIL/uL (ref 3.87–5.11)
RDW: 12.9 % (ref 11.5–15.5)
WBC: 11.5 10*3/uL — ABNORMAL HIGH (ref 4.0–10.5)
nRBC: 0 % (ref 0.0–0.2)

## 2018-05-22 MED ORDER — METOPROLOL SUCCINATE ER 100 MG PO TB24: 100.0000 mg | ORAL_TABLET | Freq: Every day | ORAL | 0 refills | Status: DC

## 2018-05-22 NOTE — Discharge Instructions (Signed)
Atrial Fibrillation ° °Atrial fibrillation is a type of heartbeat that is irregular or fast (rapid). If you have this condition, your heart beats without any order. This makes it hard for your heart to pump blood in a normal way. Having this condition gives you more risk for stroke, heart failure, and other heart problems. °Atrial fibrillation may start all of a sudden and then stop on its own, or it may become a long-lasting problem. °What are the causes? °This condition may be caused by heart conditions, such as: °· High blood pressure. °· Heart failure. °· Heart valve disease. °· Heart surgery. °Other causes include: °· Pneumonia. °· Obstructive sleep apnea. °· Lung cancer. °· Thyroid disease. °· Drinking too much alcohol. °Sometimes the cause is not known. °What increases the risk? °You are more likely to develop this condition if: °· You smoke. °· You are older. °· You have diabetes. °· You are overweight. °· You have a family history of this condition. °· You exercise often and hard. °What are the signs or symptoms? °Common symptoms of this condition include: °· A feeling like your heart is beating very fast. °· Chest pain. °· Feeling short of breath. °· Feeling light-headed or weak. °· Getting tired easily. °Follow these instructions at home: °Medicines °· Take over-the-counter and prescription medicines only as told by your doctor. °· If your doctor gives you a blood-thinning medicine, take it exactly as told. Taking too much of it can cause bleeding. Taking too little of it does not protect you against clots. Clots can cause a stroke. °Lifestyle ° °  ° °· Do not use any tobacco products. These include cigarettes, chewing tobacco, and e-cigarettes. If you need help quitting, ask your doctor. °· Do not drink alcohol. °· Do not drink beverages that have caffeine. These include coffee, soda, and tea. °· Follow diet instructions as told by your doctor. °· Exercise regularly as told by your doctor. °General  instructions °· If you have a condition that causes breathing to stop for a short period of time (apnea), treat it as told by your doctor. °· Keep a healthy weight. Do not use diet pills unless your doctor says they are safe for you. Diet pills may make heart problems worse. °· Keep all follow-up visits as told by your doctor. This is important. °Contact a doctor if: °· You notice a change in the speed, rhythm, or strength of your heartbeat. °· You are taking a blood-thinning medicine and you see more bruising. °· You get tired more easily when you move or exercise. °· You have a sudden change in weight. °Get help right away if: ° °· You have pain in your chest or your belly (abdomen). °· You have trouble breathing. °· You have blood in your vomit, poop, or pee (urine). °· You have any signs of a stroke. "BE FAST" is an easy way to remember the main warning signs: °? B - Balance. Signs are dizziness, sudden trouble walking, or loss of balance. °? E - Eyes. Signs are trouble seeing or a change in how you see. °? F - Face. Signs are sudden weakness or loss of feeling in the face, or the face or eyelid drooping on one side. °? A - Arms. Signs are weakness or loss of feeling in an arm. This happens suddenly and usually on one side of the body. °? S - Speech. Signs are sudden trouble speaking, slurred speech, or trouble understanding what people say. °? T - Time.   or loss of feeling in the face, or the face or eyelid drooping on one side.  ? A - Arms. Signs are weakness or loss of feeling in an arm. This happens suddenly and usually on one side of the body.  ? S - Speech. Signs are sudden trouble speaking, slurred speech, or trouble understanding what people say.  ? T - Time. Time to call emergency services. Write down what time symptoms started.  · You have other signs of a stroke, such as:  ? A sudden, very bad headache with no known cause.  ? Feeling sick to your stomach (nausea).  ? Throwing up (vomiting).  ? Jerky movements you cannot control (seizure).  These symptoms may be an emergency. Do not wait to see if the symptoms will go away. Get medical help right away. Call your local emergency services (911 in the [place] Do not drive yourself to the hospital.  Summary  · Atrial fibrillation is a type of heartbeat that is irregular  or fast (rapid).  · You are at higher risk of this condition if you smoke, are older, have diabetes, or are overweight.  · Follow your doctor's instructions about medicines, diet, exercise, and follow-up visits.  · Get help right away if you think that you have signs of a stroke.  This information is not intended to replace advice given to you by your health care provider. Make sure you discuss any questions you have with your health care provider.  Document Released: 12/26/2007 Document Revised: 05/09/2017 Document Reviewed: 05/09/2017  Elsevier Interactive Patient Education © 2019 [employer]

## 2018-05-22 NOTE — Anesthesia Postprocedure Evaluation (Signed)
Anesthesia Post Note  Patient: Danielle Richardson  Procedure(s) Performed: COLONOSCOPY WITH PROPOFOL (N/A )  Patient location during evaluation: PACU Anesthesia Type: General Level of consciousness: awake and alert Pain management: pain level controlled Vital Signs Assessment: post-procedure vital signs reviewed and stable Respiratory status: spontaneous breathing, nonlabored ventilation, respiratory function stable and patient connected to nasal cannula oxygen Cardiovascular status: blood pressure returned to baseline and stable Postop Assessment: no apparent nausea or vomiting Anesthetic complications: no     Last Vitals:  Vitals:   05/21/18 0909  BP: 133/78  Pulse: 89  Resp: 16  Temp: (!) 36 C  SpO2: 99%    Last Pain:  Vitals:   05/21/18 0909  TempSrc: Tympanic  PainSc: 0-No pain                 Molli Barrows

## 2018-05-24 NOTE — Discharge Summary (Signed)
[facility] NAME: Danielle Richardson    MR#:  [id number]  DATE OF BIRTH:  1945-12-27  DATE OF ADMISSION:  05/21/2018   ADMITTING PHYSICIAN: Bettey Costa, MD  DATE OF DISCHARGE: 05/22/2018 12:20 PM  PRIMARY CARE PHYSICIAN: [name], MD   ADMISSION DIAGNOSIS:  Near syncope [R55] Atrial fibrillation with RVR ([place]) [I48.91] DISCHARGE DIAGNOSIS:  Active Problems:   Atrial fibrillation ([place])  SECONDARY DIAGNOSIS:   Past Medical History:  Diagnosis Date  . Arthritis   . Cataract   . Chronic obstructive pulmonary disease ([place]) 09/11/2013  . Coronary artery disease involving native coronary artery of native heart 09/23/2016   Overview:  Minimal by cath 2017  . GERD (gastroesophageal reflux disease)   . HLD (hyperlipidemia) 09/11/2013  . Joint pain   . Left bundle branch block   . Migraine   . Pelvic pain in female   . SUI (stress urinary incontinence, female)   . Vertigo   . Vertigo    HOSPITAL COURSE:  73 year old female with history of hypertension who was awaiting colonoscopy when she developed sudden onset of palpitations.  1.  New onset atrial fibrillation with RVR: Patient is now in normal sinus rhythm and heart rate is better controlled. - Cardio doesn't recommend Anticoagulation yet. Continue metoprolol for rate control  2  Essential hypertension: Continue metoprolol for now  3.  CAD: Continue statin, metoprolol  DISCHARGE CONDITIONS:  stable CONSULTS OBTAINED:   DRUG ALLERGIES:   Allergies  Allergen Reactions  . Penicillins Rash  . Ivp Dye [Iodinated Diagnostic Agents] Swelling  . Pravastatin Other (See Comments)    Body aches  . Alendronate Nausea Only  . Codeine Nausea Only  . Oseltamivir Rash  . Rofecoxib Swelling    Swelling of feet   DISCHARGE MEDICATIONS:   Allergies as of 05/22/2018      Reactions   Penicillins Rash   Ivp Dye [iodinated Diagnostic Agents] Swelling   Pravastatin  Other (See Comments)   Body aches   Alendronate Nausea Only   Codeine Nausea Only   Oseltamivir Rash   Rofecoxib Swelling   Swelling of feet      Medication List    TAKE these medications   albuterol 108 (90 Base) MCG/ACT inhaler Commonly known as:  PROVENTIL HFA;VENTOLIN HFA Inhale 2 puffs into the lungs every 6 (six) hours as needed for wheezing or shortness of breath. Notes to patient:  TAKE AS NEEDED   aspirin EC 81 MG tablet Take 81 mg by mouth daily.   Calcium-Vitamin D-Vitamin K 500-100-40 MG-UNT-MCG Chew Chew 1 Dose by mouth daily.   clindamycin 150 MG capsule Commonly known as:  CLEOCIN Take 150 mg by mouth as needed. 4 tablets prior to dental appointment Notes to patient:  AS NEEDED FOR DENTAL APPOINTMENTS   conjugated estrogens vaginal cream Commonly known as:  PREMARIN Place 7.71 Applicatorfuls vaginally 2 (two) times a week. Notes to patient:  RESUME AS NORMAL   diazepam 10 MG tablet Commonly known as:  VALIUM Use 1 tablet daily as needed for vaginal spasms. Notes to patient:  TAKE AS NEEDED   fluticasone 50 MCG/ACT nasal spray Commonly known as:  FLONASE Place 2 sprays into the nose at bedtime. Notes to patient:  RESUME AS NORMAL   gabapentin 300 MG capsule Commonly known as:  NEURONTIN Take 300 mg by mouth 3 (three) times daily.   lidocaine 5 % ointment Commonly known as:  XYLOCAINE  Apply 1 application topically as needed. Notes to patient:  USE AS NEEDED   meclizine 25 MG tablet Commonly known as:  ANTIVERT Take 25 mg by mouth 3 (three) times daily as needed for dizziness. Notes to patient:  AS NEEDED FOR DIZZINESS   meloxicam 7.5 MG tablet Commonly known as:  MOBIC Take 7.5 mg by mouth daily.   metoprolol succinate 100 MG 24 hr tablet Commonly known as:  TOPROL-XL Take 1 tablet (100 mg total) by mouth daily. What changed:    medication strength  how much to take   MULTIVITAMIN GUMMIES ADULT PO Take 1 Dose by mouth daily.     oxybutynin 5 MG tablet Commonly known as:  DITROPAN Take 1 tablet (5 mg total) by mouth at bedtime. What changed:  additional instructions   oxyCODONE 5 MG immediate release tablet Commonly known as:  Oxy IR/ROXICODONE   pantoprazole 40 MG tablet Commonly known as:  PROTONIX Take 40 mg by mouth 2 (two) times daily.   polyethylene glycol powder powder Commonly known as:  GLYCOLAX/MIRALAX Take by mouth as needed. Notes to patient:  TAKE AS NEEDED   rizatriptan 10 MG tablet Commonly known as:  MAXALT Take 10 mg by mouth as needed. Notes to patient:  TAKE AS NEEDED   rosuvastatin 5 MG tablet Commonly known as:  CRESTOR Take 5 mg by mouth daily.        DISCHARGE INSTRUCTIONS:   DIET:  Regular diet DISCHARGE CONDITION:  Good ACTIVITY:  Activity as tolerated OXYGEN:  Home Oxygen: No.  Oxygen Delivery: room air DISCHARGE LOCATION:  home   If you experience worsening of your admission symptoms, develop shortness of breath, life threatening emergency, suicidal or homicidal thoughts you must seek medical attention immediately by calling 911 or calling your MD immediately  if symptoms less severe.  You Must read complete instructions/literature along with all the possible adverse reactions/side effects for all the Medicines you take and that have been prescribed to you. Take any new Medicines after you have completely understood and accpet all the possible adverse reactions/side effects.   Please note  You were cared for by a hospitalist during your hospital stay. If you have any questions about your discharge medications or the care you received while you were in the hospital after you are discharged, you can call the unit and asked to speak with the hospitalist on call if the hospitalist that took care of you is not available. Once you are discharged, your primary care physician will handle any further medical issues. Please note that NO REFILLS for any discharge medications  will be authorized once you are discharged, as it is imperative that you return to your primary care physician (or establish a relationship with a primary care physician if you do not have one) for your aftercare needs so that they can reassess your need for medications and monitor your lab values.    On the day of Discharge:  VITAL SIGNS:  Blood pressure 124/67, pulse 74, temperature 97.7 F (36.5 C), temperature source Oral, resp. rate 16, height 5\' 5"  (1.651 m), weight 80.8 kg, SpO2 96 %. PHYSICAL EXAMINATION:  GENERAL:  73 y.o.-year-old patient lying in the bed with no acute distress.  EYES: Pupils equal, round, reactive to light and accommodation. No scleral icterus. Extraocular muscles intact.  HEENT: Head atraumatic, normocephalic. Oropharynx and nasopharynx clear.  NECK:  Supple, no jugular venous distention. No thyroid enlargement, no tenderness.  LUNGS: Normal breath sounds bilaterally, no wheezing,  rales,rhonchi or crepitation. No use of accessory muscles of respiration.  CARDIOVASCULAR: S1, S2 normal. No murmurs, rubs, or gallops.  ABDOMEN: Soft, non-tender, non-distended. Bowel sounds present. No organomegaly or mass.  EXTREMITIES: No pedal edema, cyanosis, or clubbing.  NEUROLOGIC: Cranial nerves II through XII are intact. Muscle strength 5/5 in all extremities. Sensation intact. Gait not checked.  PSYCHIATRIC: The patient is alert and oriented x 3.  SKIN: No obvious rash, lesion, or ulcer.  DATA REVIEW:   CBC Recent Labs  Lab 05/22/18 0551  WBC 11.5*  HGB 13.6  HCT 42.0  PLT 266    Chemistries  Recent Labs  Lab 05/21/18 1118 05/22/18 0551  NA 141 140  K 4.4 3.7  CL 109 106  CO2 20* 24  GLUCOSE 123* 123*  BUN 12 19  CREATININE 0.66 0.68  CALCIUM 8.7* 8.5*  AST 39  --   ALT 32  --   ALKPHOS 51  --   BILITOT 1.1  --     Follow-up Information    [name], MD. Call today.   Specialty:  Internal Medicine Why:  to see appointment availablity,  unable to make apt at this time. Contact information: [place] [address] [phone]        Corey Skains, MD. Go on 06/04/2020.   Specialty:  Cardiology Why:  appointment time is at 9:30am. Canceled apt for March 17th. Contact information: [address] [facility] [place] [address] [phone]             Management plans discussed with the patient, family and they are in agreement.  CODE STATUS: Prior   TOTAL TIME TAKING CARE OF THIS PATIENT: 45 minutes.    Max Sane M.D on 05/24/2018 at 11:44 AM  Between 7am to 6pm - Pager - [phone]  After 6pm go to [email or web address] - Proofreader  [facility]  Office  [phone]  CC: Primary care physician; [name], MD   Note: This dictation was prepared with Dragon dictation along with smaller phrase technology. Any transcriptional errors that result from this process are unintentional.

## 2018-06-08 ENCOUNTER — Telehealth: Payer: Self-pay | Admitting: Gastroenterology

## 2018-06-08 NOTE — Telephone Encounter (Signed)
Pt is calling to reschedule her colonoscopy and to discuss her prep kit

## 2018-06-09 ENCOUNTER — Other Ambulatory Visit: Payer: Self-pay

## 2018-06-09 DIAGNOSIS — Z860100 Personal history of colon polyps, unspecified: Secondary | ICD-10-CM

## 2018-06-12 NOTE — Telephone Encounter (Signed)
Pt scheduled for her repeat colonoscopy.

## 2018-06-17 ENCOUNTER — Ambulatory Visit: Payer: Medicare Other | Admitting: Anesthesiology

## 2018-06-17 ENCOUNTER — Encounter
Admission: RE | Disposition: A | Discharge status: Home / Self Care | Payer: Self-pay | Source: Home / Self Care | Attending: Gastroenterology

## 2018-06-17 ENCOUNTER — Ambulatory Visit
Admission: RE | Admit: 2018-06-17 | Discharge: 2018-06-17 | Disposition: A | Discharge status: Home / Self Care | Payer: Medicare Other | Attending: Gastroenterology | Admitting: Gastroenterology

## 2018-06-17 ENCOUNTER — Encounter: Payer: Self-pay | Admitting: Anesthesiology

## 2018-06-17 ENCOUNTER — Other Ambulatory Visit: Payer: Self-pay

## 2018-06-17 DIAGNOSIS — I2510 Atherosclerotic heart disease of native coronary artery without angina pectoris: Secondary | ICD-10-CM | POA: Insufficient documentation

## 2018-06-17 DIAGNOSIS — D122 Benign neoplasm of ascending colon: Secondary | ICD-10-CM | POA: Diagnosis not present

## 2018-06-17 DIAGNOSIS — J449 Chronic obstructive pulmonary disease, unspecified: Secondary | ICD-10-CM | POA: Insufficient documentation

## 2018-06-17 DIAGNOSIS — Z791 Long term (current) use of non-steroidal anti-inflammatories (NSAID): Secondary | ICD-10-CM | POA: Diagnosis not present

## 2018-06-17 DIAGNOSIS — Z860100 Personal history of colon polyps, unspecified: Secondary | ICD-10-CM

## 2018-06-17 DIAGNOSIS — D120 Benign neoplasm of cecum: Secondary | ICD-10-CM

## 2018-06-17 DIAGNOSIS — K5730 Diverticulosis of large intestine without perforation or abscess without bleeding: Secondary | ICD-10-CM | POA: Diagnosis not present

## 2018-06-17 DIAGNOSIS — Z87891 Personal history of nicotine dependence: Secondary | ICD-10-CM | POA: Diagnosis not present

## 2018-06-17 DIAGNOSIS — Z09 Encounter for follow-up examination after completed treatment for conditions other than malignant neoplasm: Secondary | ICD-10-CM | POA: Diagnosis present

## 2018-06-17 DIAGNOSIS — I10 Essential (primary) hypertension: Secondary | ICD-10-CM | POA: Diagnosis not present

## 2018-06-17 DIAGNOSIS — Z85038 Personal history of other malignant neoplasm of large intestine: Secondary | ICD-10-CM | POA: Insufficient documentation

## 2018-06-17 DIAGNOSIS — Z96611 Presence of right artificial shoulder joint: Secondary | ICD-10-CM | POA: Insufficient documentation

## 2018-06-17 DIAGNOSIS — Z7982 Long term (current) use of aspirin: Secondary | ICD-10-CM | POA: Insufficient documentation

## 2018-06-17 DIAGNOSIS — Z79899 Other long term (current) drug therapy: Secondary | ICD-10-CM | POA: Diagnosis not present

## 2018-06-17 DIAGNOSIS — Z7951 Long term (current) use of inhaled steroids: Secondary | ICD-10-CM | POA: Insufficient documentation

## 2018-06-17 DIAGNOSIS — Z79890 Hormone replacement therapy (postmenopausal): Secondary | ICD-10-CM | POA: Diagnosis not present

## 2018-06-17 DIAGNOSIS — E785 Hyperlipidemia, unspecified: Secondary | ICD-10-CM | POA: Insufficient documentation

## 2018-06-17 DIAGNOSIS — I4891 Unspecified atrial fibrillation: Secondary | ICD-10-CM | POA: Diagnosis not present

## 2018-06-17 DIAGNOSIS — K219 Gastro-esophageal reflux disease without esophagitis: Secondary | ICD-10-CM | POA: Insufficient documentation

## 2018-06-17 DIAGNOSIS — G43909 Migraine, unspecified, not intractable, without status migrainosus: Secondary | ICD-10-CM | POA: Diagnosis not present

## 2018-06-17 HISTORY — PX: COLONOSCOPY WITH PROPOFOL: SHX5780

## 2018-06-17 SURGERY — COLONOSCOPY WITH PROPOFOL
Anesthesia: General

## 2018-06-17 MED ORDER — PROPOFOL 10 MG/ML IV BOLUS
INTRAVENOUS | Status: DC | PRN
  Administered 2018-06-17: 90 mg via INTRAVENOUS

## 2018-06-17 MED ORDER — PROPOFOL 500 MG/50ML IV EMUL
INTRAVENOUS | Status: DC | PRN
  Administered 2018-06-17: 110 ug/kg/min via INTRAVENOUS

## 2018-06-17 MED ORDER — PROPOFOL 10 MG/ML IV BOLUS
INTRAVENOUS | Status: AC
  Filled 2018-06-17: qty 40

## 2018-06-17 MED ORDER — SODIUM CHLORIDE 0.9 % IV SOLN
INTRAVENOUS | Status: DC
  Administered 2018-06-17: 10:00:00 via INTRAVENOUS

## 2018-06-17 NOTE — Transfer of Care (Signed)
Immediate Anesthesia Transfer of Care Note  Patient: Danielle Richardson  Procedure(s) Performed: COLONOSCOPY WITH PROPOFOL (N/A )  Patient Location: Endoscopy Unit  Anesthesia Type:General  Level of Consciousness: drowsy and patient cooperative  Airway & Oxygen Therapy: Patient Spontanous Breathing and Patient connected to nasal cannula oxygen  Post-op Assessment: Report given to RN and Post -op Vital signs reviewed and stable  Post vital signs: Reviewed and stable  Last Vitals:  Vitals Value Taken Time  BP 112/78 06/17/2018 10:31 AM  Temp 36.2 C 06/17/2018 10:30 AM  Pulse 63 06/17/2018 10:31 AM  Resp 25 06/17/2018 10:31 AM  SpO2 97 % 06/17/2018 10:31 AM  Vitals shown include unvalidated device data.  Last Pain:  Vitals:   06/17/18 1030  TempSrc: Tympanic  PainSc: Asleep         Complications: No apparent anesthesia complications

## 2018-06-17 NOTE — Anesthesia Post-op Follow-up Note (Signed)
Anesthesia QCDR form completed.        

## 2018-06-17 NOTE — Op Note (Signed)
[facility] Patient Name: Danielle Richardson Procedure Date: 06/17/2018 9:33 AM MRN: [id number] Account #: [id number] Date of Birth: Jun 26, 1945 Admit Type: Outpatient Age: 73 Room: [facility] ENDO ROOM 4 Gender: Female Note Status: Finalized Procedure:            Colonoscopy Indications:          High risk colon cancer surveillance: Personal history                        of colonic polyps Providers:             B. Bonna Gains MD, MD Referring MD:         Leonie Douglas. Doy Hutching, MD (Referring MD) Medicines:            Monitored Anesthesia Care Complications:        No immediate complications. Procedure:            Pre-Anesthesia Assessment:                       - ASA Grade Assessment: II - A patient with mild                        systemic disease.                       - Prior to the procedure, a History and Physical was                        performed, and patient medications, allergies and                        sensitivities were reviewed. The patient's tolerance of                        previous anesthesia was reviewed.                       - The risks and benefits of the procedure and the                        sedation options and risks were discussed with the                        patient. All questions were answered and informed                        consent was obtained.                       - Patient identification and proposed procedure were                        verified prior to the procedure by the physician, the                        nurse, the anesthesiologist, the anesthetist and the                        technician. The procedure was verified in the procedure  room.                       After obtaining informed consent, the colonoscope was                        passed under direct vision. Throughout the procedure,                        the patient's blood pressure, pulse, and oxygen         saturations were monitored continuously. The                        Colonoscope was introduced through the anus and                        advanced to the the cecum, identified by appendiceal                        orifice and ileocecal valve. The colonoscopy was                        performed with ease. The patient tolerated the                        procedure well. The quality of the bowel preparation                        was good. Findings:      The perianal and digital rectal examinations were normal.      A 2 mm polyp was found in the cecum. The polyp was sessile. The polyp       was removed with a cold biopsy forceps. Resection and retrieval were       complete.      Three sessile polyps were found in the ascending colon. The polyps were       4 to 6 mm in size. These polyps were removed with a cold snare.       Resection and retrieval were complete.      A tattoo was seen in the ascending colon. The tattoo site appeared       normal. No polyps were seen at or near the tattoo site.      Multiple diverticula were found in the sigmoid colon.      The exam was otherwise without abnormality.      The rectum, sigmoid colon, descending colon, transverse colon, ascending       colon and cecum appeared normal.      Retroflexion in the rectum was not performed due to narrow rectum.       Careful frontal view was normal. Impression:           - One 2 mm polyp in the cecum, removed with a cold                        biopsy forceps. Resected and retrieved.                       - Three 4 to 6 mm polyps in the ascending colon,  removed with a cold snare. Resected and retrieved.                       - A tattoo was seen in the ascending colon. The tattoo                        site appeared normal.                       - Diverticulosis in the sigmoid colon.                       - The examination was otherwise normal.                       - The rectum,  sigmoid colon, descending colon,                        transverse colon, ascending colon and cecum are normal.                       - The distal rectum and anal verge are normal on                        retroflexion view. Recommendation:       - Discharge patient to home (with escort).                       - High fiber diet.                       - Advance diet as tolerated.                       - Continue present medications.                       - Await pathology results.                       - Repeat colonoscopy in 3 years.                       - The findings and recommendations were discussed with                        the patient.                       - The findings and recommendations were discussed with                        the patient's family.                       - Return to primary care physician as previously                        scheduled. Procedure Code(s):    --- Professional ---                       [phone], Colonoscopy, flexible; with removal of tumor(s),  polyp(s), or other lesion(s) by snare technique                       45380, 59, Colonoscopy, flexible; with biopsy, single                        or multiple Diagnosis Code(s):    --- Professional ---                       Z86.010, Personal history of colonic polyps                       D12.0, Benign neoplasm of cecum                       D12.2, Benign neoplasm of ascending colon                       K57.30, Diverticulosis of large intestine without                        perforation or abscess without bleeding CPT copyright 2018 [employer]. All rights reserved. The codes documented in this report are preliminary and upon coder review may  be revised to meet current compliance requirements.  [name], MD Margretta Sidle B. Bonna Gains MD, MD 06/17/2018 10:32:30 AM This report has been signed electronically. Number of Addenda: 0 Note Initiated On: 06/17/2018  9:33 AM Scope Withdrawal Time: 0 hours 20 minutes 49 seconds  Total Procedure Duration: 0 hours 29 minutes 5 seconds  Estimated Blood Loss: Estimated blood loss: none.      [facility]

## 2018-06-17 NOTE — H&P (Signed)
[name], MD [address], [place], [place], [place], [address] [address], [place], [place], [place], [address] Phone: [phone]  Fax: [phone]  Primary Care Physician:  [name], MD   Pre-Procedure History & Physical: HPI:  Danielle Richardson is a 73 y.o. female is here for a colonoscopy.   Past Medical History:  Diagnosis Date  . Arthritis   . Cataract   . Chronic obstructive pulmonary disease ([place]) 09/11/2013  . Coronary artery disease involving native coronary artery of native heart 09/23/2016   Overview:  Minimal by cath 2017  . GERD (gastroesophageal reflux disease)   . HLD (hyperlipidemia) 09/11/2013  . Joint pain   . Left bundle branch block    A-fib, Left BBB  . Migraine   . Pelvic pain in female   . SUI (stress urinary incontinence, female)   . Vertigo   . Vertigo     Past Surgical History:  Procedure Laterality Date  . AUGMENTATION MAMMAPLASTY Bilateral    Pt had implants and then removed  . bladder tack  1999  . CARDIAC CATHETERIZATION N/A 09/12/2015   Procedure: Left Heart Cath and Coronary Angiography;  Surgeon: Corey Skains, MD;  Location: [facility];  Service: Cardiovascular;  Laterality: N/A;  . CATARACT EXTRACTION W/ INTRAOCULAR LENS  IMPLANT, BILATERAL  2018  . CHOLECYSTECTOMY  1994  . COLONOSCOPY WITH PROPOFOL N/A 02/25/2017   Procedure: COLONOSCOPY WITH PROPOFOL;  Surgeon: Virgel Manifold, MD;  Location: [facility];  Service: Endoscopy;  Laterality: N/A;  . ESOPHAGOGASTRODUODENOSCOPY (EGD) WITH PROPOFOL N/A 02/25/2017   Procedure: ESOPHAGOGASTRODUODENOSCOPY (EGD) WITH PROPOFOL;  Surgeon: Virgel Manifold, MD;  Location: [facility];  Service: Endoscopy;  Laterality: N/A;  . EYE SURGERY    . HYSTEROSCOPY W/D&C N/A 01/08/2016   Procedure: DILATATION AND CURETTAGE /HYSTEROSCOPY;  Surgeon: Brayton Mars, MD;  Location: [facility];  Service: Gynecology;  Laterality: N/A;  . JOINT  REPLACEMENT  2010   right shoulder  . NECK SURGERY  2010   Cervical Discectomy with fusion & plating  . SHOULDER SURGERY Right 2010   Total Shoulder Replacement, [facility]  . TOE SURGERY      Prior to Admission medications   Medication Sig Start Date End Date Taking? Authorizing Provider  albuterol (PROVENTIL HFA;VENTOLIN HFA) 108 (90 Base) MCG/ACT inhaler Inhale 2 puffs into the lungs every 6 (six) hours as needed for wheezing or shortness of breath.   Yes [provider]  aspirin EC 81 MG tablet Take 81 mg by mouth daily.    Yes [provider]  Calcium-Vitamin D-Vitamin K 500-100-40 MG-UNT-MCG CHEW Chew 1 Dose by mouth daily.   Yes [provider]  clindamycin (CLEOCIN) 150 MG capsule Take 150 mg by mouth as needed. 4 tablets prior to dental appointment   Yes [provider]  diazepam (VALIUM) 10 MG tablet Use 1 tablet daily as needed for vaginal spasms. 05/06/18  Yes [name], MD  fluticasone (FLONASE) 50 MCG/ACT nasal spray Place 2 sprays into the nose at bedtime.  03/28/15  Yes [provider]  meclizine (ANTIVERT) 25 MG tablet Take 25 mg by mouth 3 (three) times daily as needed for dizziness.   Yes [provider]  meloxicam (MOBIC) 7.5 MG tablet Take 7.5 mg by mouth daily.  03/28/15  Yes [provider]  metoprolol succinate (TOPROL-XL) 100 MG 24 hr tablet Take 1 tablet (100 mg total) by mouth daily. 05/22/18  Yes  Max Sane, MD  Multiple Vitamins-Minerals (MULTIVITAMIN GUMMIES ADULT PO) Take 1 Dose by mouth daily.   Yes [provider]  oxybutynin (DITROPAN) 5 MG tablet Take 1 tablet (5 mg total) by mouth at bedtime. Patient taking differently: Take 5 mg by mouth at bedtime. Taking 1/2 tablet 01/21/18  Yes Defrancesco, [name], MD  oxyCODONE (OXY IR/ROXICODONE) 5 MG immediate release tablet  10/27/17  Yes [provider]  pantoprazole (PROTONIX) 40 MG tablet Take 40 mg by mouth 2 (two) times  daily.  03/28/15  Yes [provider]  rizatriptan (MAXALT) 10 MG tablet Take 10 mg by mouth as needed.    Yes [provider]  rosuvastatin (CRESTOR) 5 MG tablet Take 5 mg by mouth daily.   Yes [provider]  conjugated estrogens (PREMARIN) vaginal cream Place 1.61 Applicatorfuls vaginally 2 (two) times a week. 01/20/17   Defrancesco, [name], MD  gabapentin (NEURONTIN) 300 MG capsule Take 300 mg by mouth 3 (three) times daily.    [provider]  lidocaine (XYLOCAINE) 5 % ointment Apply 1 application topically as needed. 05/06/18   [name], MD  polyethylene glycol powder (GLYCOLAX/MIRALAX) powder Take by mouth as needed.  09/14/13   [provider]    Allergies as of 06/10/2018 - Review Complete 05/21/2018  Allergen Reaction Noted  . Penicillins Rash 04/25/2015  . Ivp dye [iodinated diagnostic agents] Swelling 01/21/2018  . Pravastatin Other (See Comments) 10/08/2016  . Alendronate Nausea Only 04/25/2015  . Codeine Nausea Only 04/25/2015  . Oseltamivir Rash 04/25/2015  . Rofecoxib Swelling 04/25/2015    Family History  Problem Relation Age of Onset  . Diabetes Father   . Diabetes Brother   . Colon cancer Maternal Uncle   . Ovarian cancer Maternal Grandfather   . Breast cancer Neg Hx     Social History   Socioeconomic History  . Marital status: Married    Spouse name: Not on file  . Number of children: Not on file  . Years of education: Not on file  . Highest education level: Not on file  Occupational History  . Not on file  Social Needs  . Financial resource strain: Not on file  . Food insecurity:    Worry: Not on file    Inability: Not on file  . Transportation needs:    Medical: Not on file    Non-medical: Not on file  Tobacco Use  . Smoking status: Former Smoker    Last attempt to quit: 04/02/1999    Years since quitting: 19.2  . Smokeless tobacco: Never Used  Substance and Sexual Activity  . Alcohol use: Not  Currently  . Drug use: No  . Sexual activity: Not Currently    Birth control/protection: Post-menopausal  Lifestyle  . Physical activity:    Days per week: 0 days    Minutes per session: 0 min  . Stress: Not on file  Relationships  . Social connections:    Talks on phone: Not on file    Gets together: Not on file    Attends religious service: Not on file    Active member of club or organization: Not on file    Attends meetings of clubs or organizations: Not on file    Relationship status: Not on file  . Intimate partner violence:    Fear of current or ex partner: Not on file    Emotionally abused: Not on file    Physically abused: Not on file  Forced sexual activity: Not on file  Other Topics Concern  . Not on file  Social History Narrative  . Not on file    Review of Systems: See HPI, otherwise negative ROS  Physical Exam: There were no vitals taken for this visit. General:   Alert,  pleasant and cooperative in NAD Head:  Normocephalic and atraumatic. Neck:  Supple; no masses or thyromegaly. Lungs:  Clear throughout to auscultation, normal respiratory effort.    Heart:  +S1, +S2, Regular rate and rhythm, No edema. Abdomen:  Soft, nontender and nondistended. Normal bowel sounds, without guarding, and without rebound.   Neurologic:  Alert and  oriented x4;  grossly normal neurologically.  Impression/Plan: Braylei S [name] is here for a colonoscopy to be performed for polyp surveillance  Risks, benefits, limitations, and alternatives regarding  colonoscopy have been reviewed with the patient.  Questions have been answered.  All parties agreeable.   Virgel Manifold, MD  06/17/2018, 9:41 AM

## 2018-06-17 NOTE — Anesthesia Postprocedure Evaluation (Signed)
Anesthesia Post Note  Patient: Danielle Richardson  Procedure(s) Performed: COLONOSCOPY WITH PROPOFOL (N/A )  Patient location during evaluation: Endoscopy Anesthesia Type: General Level of consciousness: awake and alert Pain management: pain level controlled Vital Signs Assessment: post-procedure vital signs reviewed and stable Respiratory status: spontaneous breathing, nonlabored ventilation, respiratory function stable and patient connected to nasal cannula oxygen Cardiovascular status: blood pressure returned to baseline and stable Postop Assessment: no apparent nausea or vomiting Anesthetic complications: no     Last Vitals:  Vitals:   06/17/18 1050 06/17/18 1058  BP: (!) 102/51 123/71  Pulse: 61 (!) 58  Resp: 18 17  Temp:    SpO2: 100% 100%    Last Pain:  Vitals:   06/17/18 1058  TempSrc:   PainSc: 0-No pain                 Abdo Denault S

## 2018-06-17 NOTE — Anesthesia Preprocedure Evaluation (Signed)
Anesthesia Evaluation  Patient identified by MRN, date of birth, ID band Patient awake    Reviewed: Allergy & Precautions, NPO status , Patient's Chart, lab work & pertinent test results, reviewed documented beta blocker date and time   Airway Mallampati: III  TM Distance: >3 FB     Dental  (+) Chipped   Pulmonary COPD, former smoker,           Cardiovascular hypertension, Pt. on medications and Pt. on home beta blockers + angina + CAD  + dysrhythmias Atrial Fibrillation      Neuro/Psych  Headaches,    GI/Hepatic GERD  ,  Endo/Other    Renal/GU      Musculoskeletal  (+) Arthritis ,   Abdominal   Peds  Hematology   Anesthesia Other Findings EKG shows Lbbb. No cardiac stents.  Reproductive/Obstetrics                             Anesthesia Physical Anesthesia Plan  ASA: III  Anesthesia Plan: General   Post-op Pain Management:    Induction: Intravenous  PONV Risk Score and Plan:   Airway Management Planned:   Additional Equipment:   Intra-op Plan:   Post-operative Plan:   Informed Consent: I have reviewed the patients History and Physical, chart, labs and discussed the procedure including the risks, benefits and alternatives for the proposed anesthesia with the patient or authorized representative who has indicated his/her understanding and acceptance.       Plan Discussed with: CRNA  Anesthesia Plan Comments:         Anesthesia Quick Evaluation

## 2018-06-18 LAB — SURGICAL PATHOLOGY

## 2018-06-19 ENCOUNTER — Encounter: Payer: Self-pay | Admitting: Gastroenterology

## 2018-07-07 ENCOUNTER — Encounter: Payer: Self-pay | Admitting: Obstetrics and Gynecology

## 2018-07-07 ENCOUNTER — Encounter: Payer: Medicare Other | Admitting: Obstetrics and Gynecology

## 2018-07-07 ENCOUNTER — Other Ambulatory Visit: Payer: Self-pay

## 2018-07-07 ENCOUNTER — Ambulatory Visit (INDEPENDENT_AMBULATORY_CARE_PROVIDER_SITE_OTHER): Payer: Medicare Other | Admitting: Obstetrics and Gynecology

## 2018-07-07 VITALS — Ht 65.0 in | Wt 184.0 lb

## 2018-07-07 DIAGNOSIS — M791 Myalgia, unspecified site: Secondary | ICD-10-CM

## 2018-07-07 DIAGNOSIS — N952 Postmenopausal atrophic vaginitis: Secondary | ICD-10-CM

## 2018-07-07 DIAGNOSIS — N941 Unspecified dyspareunia: Secondary | ICD-10-CM | POA: Diagnosis not present

## 2018-07-07 DIAGNOSIS — M6289 Other specified disorders of muscle: Secondary | ICD-10-CM

## 2018-07-07 MED ORDER — DIAZEPAM 10 MG PO TABS: ORAL_TABLET | ORAL | 1 refills | Status: DC

## 2018-07-07 MED ORDER — LIDOCAINE HCL URETHRAL/MUCOSAL 2 % EX GEL: 1.0000 "application " | CUTANEOUS | 2 refills | Status: DC | PRN

## 2018-07-07 NOTE — Progress Notes (Signed)
Virtual Visit via Telephone Note  I connected with Danielle Richardson on 07/07/18 at  2:30 PM EDT by telephone and verified that I am speaking with the correct person using two identifiers.   I discussed the limitations, risks, security and privacy concerns of performing an evaluation and management service by telephone and the availability of in person appointments. I also discussed with the patient that there may be a patient responsible charge related to this service. The patient expressed understanding and agreed to proceed.   History of Present Illness: Danielle Richardson is a 73 y.o. [id number] female who is following up for trigger point pain, dyspareunia, pelvic floor dysfunction, and vaginal atrophy.  She reports that she has discontinued use of the Gabapentin. She states that she has been taking her Premarin cream as prescribed  Was started on vaginal Valium last visit which she notes does help some, but just "takes a while to kick in".  Is noting issues from her pharmacy with coverage.  Also tried to use the lidocaine ointment that was prescribed last visit, but notes that she felt she wasn't getting much use out of it as it was difficult using with her premarin applicator (notes most of it was still stuck within the applicator after use).  Also notes that it was very expensive as well.  Lastly, she still declines another trigger point injection as she states that the pain of the injection was very traumatizing, and the effects were short-lived (ony lasted for ~ 1 week).  She also states that the referral for pelvic floor physical therapy did not occur due to her insurance (notes this particular place was cash only and could not afford $75 each visit), as well as during the COVID-19 pandemic no one is taking on any new patients at this time unless emergent.    Observations/Objective:  Height 5\' 5"  (1.651 m), weight 184 lb (83.5 kg).  Assessment: 1. Trigger point pain 2. Dyspareunia 3. Pelvic  floor dysfunction 4. Mild vaginal atrophy  Plan:  Will refill her vaginal diazepam.  Also will change from the lidocaine ointment to the gel as more cost effective. Reviewed step-by-step process of insertion and use of the lidocaine gel at the introitus. Can consider referral to a Urogynecologist or pelvic pain specialist once pandemic dies down. Also encouraged patient to contact her insurance company to see if there are any other physical therapy locations that will accept her insurance.  She will f/u in 1 month for another televisit.  Follow Up Instructions:  I discussed the assessment and treatment plan with the patient. The patient was provided an opportunity to ask questions and all were answered. The patient agreed with the plan and demonstrated an understanding of the instructions.   The patient was advised to call back or seek an in-person evaluation if the symptoms worsen or if the condition fails to improve as anticipated.  I provided 15 minutes of non-face-to-face time during this encounter.   [name], MD  [facility]

## 2018-07-07 NOTE — Progress Notes (Signed)
Telephone visit transferred from front desk. CC, vitals, medications and allergies reviewed and updated. Pt is having a telephone visit today due to pelvic pain. Pt stated that she is still having pelvic and lower back pain.

## 2018-08-06 ENCOUNTER — Encounter: Payer: Self-pay | Admitting: Obstetrics and Gynecology

## 2018-08-06 ENCOUNTER — Telehealth: Payer: Self-pay

## 2018-08-06 ENCOUNTER — Other Ambulatory Visit: Payer: Self-pay

## 2018-08-06 ENCOUNTER — Ambulatory Visit: Payer: Medicare Other | Admitting: Obstetrics and Gynecology

## 2018-08-06 NOTE — Telephone Encounter (Signed)
Spoke to the pharmacy about the pt's medication after she stated that the medication was not at the pharmacy. They stated that her medication was pulled back on 07/07/18 and the pt did not come by to pick it up.

## 2018-08-06 NOTE — Telephone Encounter (Signed)
Pt called to inform her that her medication was ready for pick up and that the lidocine was going to be OTC and would have to be picked up from there. Pt voiced that she understood.

## 2018-08-06 NOTE — Progress Notes (Unsigned)
Telephone visit. Pt was transferred from the front desk. Pt is having a televisit due to having pelvic pain. Pt stated that she is still having pelvic pain.

## 2018-10-05 ENCOUNTER — Other Ambulatory Visit: Payer: Self-pay | Admitting: Internal Medicine

## 2018-10-05 DIAGNOSIS — Z1231 Encounter for screening mammogram for malignant neoplasm of breast: Secondary | ICD-10-CM

## 2018-11-12 ENCOUNTER — Other Ambulatory Visit: Payer: Self-pay

## 2018-11-12 ENCOUNTER — Ambulatory Visit
Admission: RE | Admit: 2018-11-12 | Discharge: 2018-11-12 | Disposition: A | Discharge status: Home / Self Care | Payer: Medicare Other | Source: Ambulatory Visit | Attending: Internal Medicine | Admitting: Internal Medicine

## 2018-11-12 DIAGNOSIS — Z1231 Encounter for screening mammogram for malignant neoplasm of breast: Secondary | ICD-10-CM | POA: Diagnosis not present

## 2018-12-09 ENCOUNTER — Other Ambulatory Visit: Payer: Self-pay

## 2018-12-11 ENCOUNTER — Telehealth: Payer: Self-pay

## 2018-12-11 NOTE — Telephone Encounter (Signed)
Pt called no answer LM via VM to call the office to speak about her medication refill.

## 2018-12-15 ENCOUNTER — Ambulatory Visit: Payer: Medicare Other | Admitting: Urology

## 2018-12-15 ENCOUNTER — Telehealth: Payer: Self-pay

## 2018-12-15 NOTE — Telephone Encounter (Signed)
Called pt concerning medication refill. Pt was asked to see if her urologist  could refill oxybutynin for her. Pt stated that she has an appointment tomorrow and would ask him.

## 2018-12-17 ENCOUNTER — Encounter: Payer: Self-pay | Admitting: Urology

## 2018-12-17 ENCOUNTER — Ambulatory Visit
Admission: RE | Admit: 2018-12-17 | Discharge: 2018-12-17 | Disposition: A | Discharge status: Home / Self Care | Payer: Medicare Other | Source: Ambulatory Visit | Attending: Urology | Admitting: Urology

## 2018-12-17 ENCOUNTER — Ambulatory Visit (INDEPENDENT_AMBULATORY_CARE_PROVIDER_SITE_OTHER): Payer: Medicare Other | Admitting: Urology

## 2018-12-17 ENCOUNTER — Other Ambulatory Visit: Payer: Self-pay

## 2018-12-17 VITALS — BP 148/70 | HR 66 | Ht 65.0 in | Wt 185.0 lb

## 2018-12-17 DIAGNOSIS — N200 Calculus of kidney: Secondary | ICD-10-CM | POA: Diagnosis not present

## 2018-12-17 DIAGNOSIS — R3129 Other microscopic hematuria: Secondary | ICD-10-CM

## 2018-12-17 DIAGNOSIS — R102 Pelvic and perineal pain: Secondary | ICD-10-CM

## 2018-12-17 MED ORDER — OXYBUTYNIN CHLORIDE 5 MG PO TABS: 5.0000 mg | ORAL_TABLET | Freq: Every day | ORAL | 3 refills | Status: DC

## 2018-12-17 NOTE — Patient Instructions (Signed)
Pelvic Floor Dysfunction  Pelvic floor dysfunction (PFD) is a condition that results when the group of muscles and connective tissues that support the organs in the pelvis (pelvic floor muscles) do not work well. These muscles and their connections form a sling that supports the colon and bladder. In men, these muscles also support the prostate gland. In women, they also support the uterus. PFD causes pelvic floor muscles to be too weak, too tight, or a combination of both. In PFD, muscle movements are not coordinated. This condition may cause bowel or bladder problems. It may also cause pain. What are the causes? This condition may be caused by an injury to the pelvic area or by a weakening of pelvic muscles. This often results from pregnancy and childbirth or other types of strain. In many cases, the exact cause is not known. What increases the risk? The following factors may make you more likely to develop this condition:  Having a condition of chronic bladder tissue inflammation (interstitial cystitis).  Being an older person.  Being overweight.  Radiation treatment for cancer in the pelvic region.  Previous pelvic surgery, such as removal of the uterus (hysterectomy) or prostate gland (prostatectomy). What are the signs or symptoms? Symptoms of this condition vary and may include:  Bladder symptoms, such as: ? Trouble starting urination and emptying the bladder. ? Frequent urinary tract infections. ? Leaking urine when coughing, laughing, or exercising (stress incontinence). ? Having to pass urine urgently or frequently. ? Pain when passing urine.  Bowel symptoms, such as: ? Constipation. ? Urgent or frequent bowel movements. ? Incomplete bowel movements. ? Painful bowel movements. ? Leaking stool or gas.  Unexplained genital or rectal pain.  Genital or rectal muscle spasms.  Low back pain. In women, symptoms of PFD may also include:  A heavy, full, or aching feeling in  the vagina.  A bulge that protrudes into the vagina.  Pain during or after sexual intercourse. How is this diagnosed? This condition may be diagnosed based on:  Your symptoms and medical history.  A physical exam. During the exam, your health care provider may check your pelvic muscles for tightness, spasm, pain, or weakness. This may include a rectal exam and a pelvic exam for women. In some cases, you may have diagnostic tests, such as:  Electrical muscle function tests.  Urine flow testing.  X-ray tests of bowel function.  Ultrasound of the pelvic organs. How is this treated? Treatment for this condition depends on your symptoms. Treatment options include:  Physical therapy. This may include Kegel exercises to help relax or strengthen the pelvic floor muscles.  Biofeedback. This type of therapy provides feedback on how tight your pelvic floor muscles are so that you can learn to control them.  Internal or external massage therapy.  A treatment that involves electrical stimulation of the pelvic floor muscles to help control pain (transcutaneous electrical nerve stimulation, or TENS).  Sound wave therapy (ultrasound) to reduce muscle spasms.  Medicines, such as: ? Muscle relaxants. ? Bladder control medicines. Surgery to reconstruct or support pelvic floor muscles may be an option if other treatments do not help. Follow these instructions at home: Activity  Do your usual activities as told by your health care provider. Ask your health care provider if you should modify any activities.  Do pelvic floor strengthening or relaxing exercises at home as told by your physical therapist. Lifestyle  Maintain a healthy weight.  Eat foods that are high in fiber, such as  beans, whole grains, and fresh fruits and vegetables.  Limit foods that are high in fat and processed sugars, such as fried or sweet foods.  Manage stress with relaxation techniques such as yoga or meditation.  General instructions  If you have problems with leakage: ? Use absorbable pads or wear padded underwear. ? Wash frequently with mild soap. ? Keep your genital and anal area as clean and dry as possible. ? Ask your health care provider if you should try a barrier cream to prevent skin irritation.  Take warm baths to relieve pelvic muscle tension or spasms.  Take over-the-counter and prescription medicines only as told by your health care provider.  Keep all follow-up visits as told by your health care provider. This is important. Contact a health care provider if you:  Are not improving with home care.  Have signs or symptoms of PFD that get worse at home.  Develop new signs or symptoms at home.  Have signs of a urinary tract infection, such as: ? Fever. ? Chills. ? Urinary frequency. ? A burning feeling when urinating.  Have not had a bowel movement in 3 days (constipation). Summary  Pelvic floor dysfunction results when the muscles and connective tissues in your pelvic floor do not work well.  These muscles and their connections form a sling that supports your colon and bladder. In men, these muscles also support the prostate gland. In women, they also support the uterus.  PFD may be caused by an injury to the pelvic area or by a weakening of pelvic muscles.  PFD causes pelvic floor muscles to be too weak, too tight, or a combination of both. Symptoms may vary from person to person.  In most cases, PFD can be treated with physical therapies and medicines. Surgery may be an option if other treatments do not help. This information is not intended to replace advice given to you by your health care provider. Make sure you discuss any questions you have with your health care provider. Document Released: 10/06/2017 Document Revised: 10/06/2017 Document Reviewed: 10/06/2017 Elsevier Patient Education  2020 [employer] Female Pelvic Floor  The pelvic floor consists of  several layers of muscles that cover the bottom of the pelvic cavity. These muscles have several distinct roles:  1. To support the pelvic organs, the bladder, uterus and colon within the pelvis. 2. To assist in stopping and starting the flow of urine or the passage of gas or stool.To aid in sexual appreciation.  How to Locate the Pelvic Floor Muscles  The Urine Stop Test . At the midstream of your urine flow, squeeze the pelvic floor muscles. You should feel the sensation of the openings close and the muscles pulling up and into the pelvic cavity.  If you have strong muscles you will slow or stop the stream of urine. . Stop or slow the flow of urine without tensing the muscles of your legs, buttocks. . Do this only to locate the muscles, not as a daily exercise. Feeling the Muscle . Place a fingertip on the anal opening. Contract and lift the muscles as though you are holding back gas or stool. You will feel your anal opening tighten. . Insert 1 or 2 fingers into the vagina to feel the contraction and lifting of the muscles. You should feel the opening of the vagina tighten around your finger. Watching the Muscle Contract . Begin by lying on a flat surface.  Position yourself with your knees apart and  bent with your head elevated and supported on several pillows. Use a mirror to look at the anal and vaginal openings and the perineal body (the area between the two openings).  . Contract or tighten the muscles around the openings and watch for a lifting of the perineal body and closure of the openings.   . If you see a bulge or feel tissues coming out of your openings, this is an incorrect contraction and you should notify your health care provider for more instructions.

## 2018-12-17 NOTE — Progress Notes (Signed)
   12/17/2018 2:39 PM   Danielle Richardson Mar 27, 1946 [id number]  Reason for visit: Follow up nephrolithiasis, pelvic/vaginal pain  HPI: I saw [name] back in urology clinic today for follow-up of nephrolithiasis and pelvic/vaginal pain.  She is a 73 year old female that underwent a microscopic hematuria work-up in September 2019 that showed a asymptomatic 1.4 cm right lower pole nonobstructing kidney stone, and a normal cystoscopy.  She has chronic sharp intermittent vaginal pain.  She was followed by GYN for this, and did not have any improvement with lidocaine gel or Valium suppositories.  She was unable to afford pelvic floor physical therapy and did not follow-up with them.  She has noticed a significant improvement in her pelvic pain by managing her constipation better.  She denies any flank pain or recurrent UTIs since her last visit.  KUB today shows stable right lower pole 1.4 cm stone.  We again discussed options including observation or ureteroscopy, laser lithotripsy, stent placement.  She elects for observation which is very reasonable.  We discussed general stone prevention strategies including adequate hydration with goal of producing 2.5 L of urine daily, increasing citric acid intake, increasing calcium intake during high oxalate meals, minimizing animal protein, and decreasing salt intake. Information about dietary recommendations given today.   Pelvic floor dysfunction information provided RTC 1 year with KUB prior, sooner if problems   A total of 15 minutes were spent face-to-face with the patient, greater than 50% was spent in patient education, counseling, and coordination of care regarding nephrolithiasis and pelvic pain.  [place], [facility] [address] [place], [place] [address] [phone]

## 2019-05-22 ENCOUNTER — Emergency Department: Payer: Medicare Other

## 2019-05-22 ENCOUNTER — Emergency Department
Admission: EM | Admit: 2019-05-22 | Discharge: 2019-05-22 | Disposition: A | Discharge status: Home / Self Care | Payer: Medicare Other | Attending: Student | Admitting: Student

## 2019-05-22 ENCOUNTER — Encounter: Payer: Self-pay | Admitting: Emergency Medicine

## 2019-05-22 ENCOUNTER — Other Ambulatory Visit: Payer: Self-pay

## 2019-05-22 DIAGNOSIS — S2232XA Fracture of one rib, left side, initial encounter for closed fracture: Secondary | ICD-10-CM | POA: Insufficient documentation

## 2019-05-22 DIAGNOSIS — Z79899 Other long term (current) drug therapy: Secondary | ICD-10-CM | POA: Diagnosis not present

## 2019-05-22 DIAGNOSIS — Z87891 Personal history of nicotine dependence: Secondary | ICD-10-CM | POA: Diagnosis not present

## 2019-05-22 DIAGNOSIS — Z7982 Long term (current) use of aspirin: Secondary | ICD-10-CM | POA: Diagnosis not present

## 2019-05-22 DIAGNOSIS — J449 Chronic obstructive pulmonary disease, unspecified: Secondary | ICD-10-CM | POA: Diagnosis not present

## 2019-05-22 DIAGNOSIS — Z96611 Presence of right artificial shoulder joint: Secondary | ICD-10-CM | POA: Diagnosis not present

## 2019-05-22 DIAGNOSIS — W19XXXA Unspecified fall, initial encounter: Secondary | ICD-10-CM

## 2019-05-22 DIAGNOSIS — R1012 Left upper quadrant pain: Secondary | ICD-10-CM | POA: Insufficient documentation

## 2019-05-22 DIAGNOSIS — I25110 Atherosclerotic heart disease of native coronary artery with unstable angina pectoris: Secondary | ICD-10-CM | POA: Insufficient documentation

## 2019-05-22 DIAGNOSIS — Y929 Unspecified place or not applicable: Secondary | ICD-10-CM | POA: Insufficient documentation

## 2019-05-22 DIAGNOSIS — Y93E1 Activity, personal bathing and showering: Secondary | ICD-10-CM | POA: Insufficient documentation

## 2019-05-22 DIAGNOSIS — W182XXA Fall in (into) shower or empty bathtub, initial encounter: Secondary | ICD-10-CM | POA: Insufficient documentation

## 2019-05-22 DIAGNOSIS — Y999 Unspecified external cause status: Secondary | ICD-10-CM | POA: Insufficient documentation

## 2019-05-22 DIAGNOSIS — S299XXA Unspecified injury of thorax, initial encounter: Secondary | ICD-10-CM | POA: Diagnosis present

## 2019-05-22 MED ORDER — ONDANSETRON 4 MG PO TBDP
4.0000 mg | ORAL_TABLET | Freq: Once | ORAL | Status: AC
  Administered 2019-05-22: 21:00:00 4 mg via ORAL
  Filled 2019-05-22: qty 1

## 2019-05-22 MED ORDER — ONDANSETRON HCL 4 MG PO TABS: 4.0000 mg | ORAL_TABLET | Freq: Three times a day (TID) | ORAL | 0 refills | Status: AC | PRN

## 2019-05-22 MED ORDER — OXYCODONE-ACETAMINOPHEN 5-325 MG PO TABS
1.0000 | ORAL_TABLET | Freq: Once | ORAL | Status: AC
  Administered 2019-05-22: 21:00:00 1 via ORAL
  Filled 2019-05-22: qty 1

## 2019-05-22 MED ORDER — OXYCODONE-ACETAMINOPHEN 5-325 MG PO TABS: 1.0000 | ORAL_TABLET | Freq: Four times a day (QID) | ORAL | 0 refills | Status: AC | PRN

## 2019-05-22 NOTE — ED Triage Notes (Signed)
Patient ambulatory to triage with complaints of falling in the shower tonight, pt reports leaning out of the shower to get a washcloth and when her weight transferred back to the in shower foot pt fell against the fold down seat in the shower.  Pt c/o pain in the left mid back - red area visible  Pt denies LOC or blood thinners, reports pain worse with movement.  Pt denies taking medications at time before coming to ED.  Speaking in complete coherent sentences. No acute breathing distress noted.

## 2019-05-22 NOTE — Discharge Instructions (Signed)
Schedule non-emergent liver MRI as discussed during this emergency department encounter.

## 2019-05-22 NOTE — ED Provider Notes (Signed)
Emergency Department Provider Note  ____________________________________________  Time seen: Approximately 9:13 PM  I have reviewed the triage vital signs and the nursing notes.   HISTORY  Chief Complaint Fall   Historian Patient     HPI Danielle Richardson is a 74 y.o. female presents to the emergency department with left upper quadrant abdominal pain and flank pain after patient fell while in the shower.  Patient reports that she stepped out to grab a washcloth and lost her balance.  She denies hitting her head and neck and states that she fell onto a shower seat.  No numbness or tingling in the upper and lower extremities.  No laceration sustained.  Patient denies similar injuries in the past.  No other alleviating measures have been attempted.   Past Medical History:  Diagnosis Date  . Arthritis   . Cataract   . Chronic obstructive pulmonary disease ([place]) 09/11/2013  . Coronary artery disease involving native coronary artery of native heart 09/23/2016   Overview:  Minimal by cath 2017  . GERD (gastroesophageal reflux disease)   . HLD (hyperlipidemia) 09/11/2013  . Joint pain   . Left bundle branch block    A-fib, Left BBB  . Migraine   . Pelvic pain in female   . SUI (stress urinary incontinence, female)   . Vertigo   . Vertigo      Immunizations up to date:  Yes.     Past Medical History:  Diagnosis Date  . Arthritis   . Cataract   . Chronic obstructive pulmonary disease ([place]) 09/11/2013  . Coronary artery disease involving native coronary artery of native heart 09/23/2016   Overview:  Minimal by cath 2017  . GERD (gastroesophageal reflux disease)   . HLD (hyperlipidemia) 09/11/2013  . Joint pain   . Left bundle branch block    A-fib, Left BBB  . Migraine   . Pelvic pain in female   . SUI (stress urinary incontinence, female)   . Vertigo   . Vertigo     Patient Active Problem List   Diagnosis Date Noted  . History of colonic polyps   . Benign  neoplasm of ascending colon   . Diverticulosis of large intestine without diverticulitis   . Benign neoplasm of cecum   . Atrial fibrillation ([place]) 05/21/2018  . Trigger point, vagina 02/11/2018  . Benign essential HTN 09/23/2016  . Coronary artery disease involving native coronary artery of native heart 09/23/2016  . Unstable angina ([place]) 08/31/2015  . Complete left bundle branch block 08/31/2015  . Endometrial polyp 06/01/2015  . Uterine fibroid 05/23/2015  . Gastric catarrh 04/25/2015  . Climacteric 04/25/2015  . Chest pain, non-cardiac 04/25/2015  . Arthritis, degenerative 04/25/2015  . Mixed incontinence urge and stress 04/25/2015  . Dyspareunia, female 04/25/2015  . Acute frontal sinusitis 03/30/2014  . Other synovitis and tenosynovitis, right shoulder 03/30/2014  . History of artificial joint 03/30/2014  . Right supraspinatus tenosynovitis 03/30/2014  . Common migraine with intractable migraine 10/19/2013  . Allergic rhinitis 09/11/2013  . Chronic obstructive pulmonary disease ([place]) 09/11/2013  . Acid reflux 09/11/2013  . BP (high blood pressure) 09/11/2013  . History of migraine headaches 09/11/2013  . HLD (hyperlipidemia) 09/11/2013  . OP (osteoporosis) 09/11/2013  . Awareness of heartbeats 09/11/2013    Past Surgical History:  Procedure Laterality Date  . AUGMENTATION MAMMAPLASTY Bilateral    Pt had implants and then removed  . bladder tack  1999  . CARDIAC CATHETERIZATION N/A 09/12/2015  Procedure: Left Heart Cath and Coronary Angiography;  Surgeon: Corey Skains, MD;  Location: [facility];  Service: Cardiovascular;  Laterality: N/A;  . CATARACT EXTRACTION W/ INTRAOCULAR LENS  IMPLANT, BILATERAL  2018  . CHOLECYSTECTOMY  1994  . COLONOSCOPY WITH PROPOFOL N/A 02/25/2017   Procedure: COLONOSCOPY WITH PROPOFOL;  Surgeon: Virgel Manifold, MD;  Location: [facility];  Service: Endoscopy;  Laterality: N/A;  . COLONOSCOPY WITH PROPOFOL N/A  06/17/2018   Procedure: COLONOSCOPY WITH PROPOFOL;  Surgeon: Virgel Manifold, MD;  Location: [facility];  Service: Endoscopy;  Laterality: N/A;  . ESOPHAGOGASTRODUODENOSCOPY (EGD) WITH PROPOFOL N/A 02/25/2017   Procedure: ESOPHAGOGASTRODUODENOSCOPY (EGD) WITH PROPOFOL;  Surgeon: Virgel Manifold, MD;  Location: [facility];  Service: Endoscopy;  Laterality: N/A;  . EYE SURGERY    . HYSTEROSCOPY WITH D & C N/A 01/08/2016   Procedure: DILATATION AND CURETTAGE /HYSTEROSCOPY;  Surgeon: Brayton Mars, MD;  Location: [facility];  Service: Gynecology;  Laterality: N/A;  . JOINT REPLACEMENT  2010   right shoulder  . NECK SURGERY  2010   Cervical Discectomy with fusion & plating  . SHOULDER SURGERY Right 2010   Total Shoulder Replacement, [facility]  . TOE SURGERY      Prior to Admission medications   Medication Sig Start Date End Date Taking? Authorizing Provider  albuterol (PROVENTIL HFA;VENTOLIN HFA) 108 (90 Base) MCG/ACT inhaler Inhale 2 puffs into the lungs every 6 (six) hours as needed for wheezing or shortness of breath.    [provider]  aspirin EC 81 MG tablet Take 81 mg by mouth daily.     [provider]  Calcium-Vitamin D-Vitamin K 500-100-40 MG-UNT-MCG CHEW Chew 1 Dose by mouth daily.    [provider]  clindamycin (CLEOCIN) 150 MG capsule Take 150 mg by mouth as needed. 4 tablets prior to dental appointment    [provider]  conjugated estrogens (PREMARIN) vaginal cream Place [id number] Applicatorfuls vaginally 2 (two) times a week. 01/20/17   Defrancesco, [name], MD  fluticasone (FLONASE) 50 MCG/ACT nasal spray Place 2 sprays into the nose at bedtime.  03/28/15   [provider]  meclizine (ANTIVERT) 25 MG tablet Take 25 mg by mouth 3 (three) times daily as needed for dizziness.    [provider]  meloxicam (MOBIC) 7.5 MG tablet Take 7.5 mg by mouth daily.  03/28/15   [provider]   metoprolol succinate (TOPROL-XL) 100 MG 24 hr tablet Take 1 tablet (100 mg total) by mouth daily. 05/22/18   Max Sane, MD  Multiple Vitamins-Minerals (MULTIVITAMIN GUMMIES ADULT PO) Take 1 Dose by mouth daily.    [provider]  ondansetron (ZOFRAN) 4 MG tablet Take 1 tablet (4 mg total) by mouth every 8 (eight) hours as needed for up to 5 days for nausea or vomiting. 05/22/19 05/27/19  Lannie Fields, PA-C  oxybutynin (DITROPAN) 5 MG tablet Take 1 tablet (5 mg total) by mouth at bedtime. 12/17/18   Billey Co, MD  oxyCODONE (OXY IR/ROXICODONE) 5 MG immediate release tablet  10/27/17   [provider]  oxyCODONE-acetaminophen (PERCOCET/ROXICET) 5-325 MG tablet Take 1 tablet by mouth every 6 (six) hours as needed for up to 3 days. 05/22/19 05/25/19  Lannie Fields, PA-C  pantoprazole (PROTONIX) 40 MG tablet Take 40 mg by mouth 2 (two) times daily.  03/28/15   [provider]  polyethylene glycol powder (GLYCOLAX/MIRALAX) powder Take by mouth as needed.  09/14/13   [provider]  rizatriptan (MAXALT) 10 MG tablet Take 10 mg by mouth as needed.     [provider]  rosuvastatin (CRESTOR) 5 MG tablet Take 5 mg by mouth daily.    [provider]    Allergies Penicillins, Ivp dye [iodinated diagnostic agents], Pravastatin, Alendronate, Codeine, Oseltamivir, and Rofecoxib  Family History  Problem Relation Age of Onset  . Diabetes Father   . Diabetes Brother   . Colon cancer Maternal Uncle   . Ovarian cancer Maternal Grandfather   . Breast cancer Neg Hx     Social History Social History   Tobacco Use  . Smoking status: Former Smoker    Quit date: 04/02/1999    Years since quitting: 20.1  . Smokeless tobacco: Never Used  Substance Use Topics  . Alcohol use: Not Currently  . Drug use: No     Review of Systems  Constitutional: No fever/chills Eyes:  No discharge ENT: No upper respiratory complaints. Respiratory: no cough. No  SOB/ use of accessory muscles to breath Gastrointestinal: Patient has left upper quadrant pain and left flank pain.  Musculoskeletal: Negative for musculoskeletal pain. Skin: Negative for rash, abrasions, lacerations, ecchymosis.    ____________________________________________   PHYSICAL EXAM:  VITAL SIGNS: ED Triage Vitals  Enc Vitals Group     BP 05/22/19 1945 (!) 158/80     Pulse Rate 05/22/19 1945 80     Resp 05/22/19 1945 18     Temp 05/22/19 1945 98.5 F (36.9 C)     Temp Source 05/22/19 1945 Oral     SpO2 05/22/19 1945 98 %     Weight 05/22/19 1946 189 lb (85.7 kg)     Height 05/22/19 1946 5\' 6"  (1.676 m)     Head Circumference --      Peak Flow --      Pain Score 05/22/19 1946 8     Pain Loc --      Pain Edu? --      Excl. in [place]? --      Constitutional: Alert and oriented. Well appearing and in no acute distress. Eyes: Conjunctivae are normal. PERRL. EOMI. Head: Atraumatic. ENT:      Nose: No congestion/rhinnorhea.      Mouth/Throat: Mucous membranes are moist.  Neck: No stridor.  No cervical spine tenderness to palpation. Cardiovascular: Normal rate, regular rhythm. Normal S1 and S2.  Good peripheral circulation. Respiratory: Normal respiratory effort without tachypnea or retractions. Lungs CTAB. Good air entry to the bases with no decreased or absent breath sounds Gastrointestinal: Bowel sounds x 4 quadrants.  Patient has left upper quadrant and left flank tenderness with guarding to palpation. Musculoskeletal: Full range of motion to all extremities. No obvious deformities noted Neurologic:  Normal for age. No gross focal neurologic deficits are appreciated.  Skin:  Skin is warm, dry and intact. No rash noted. Psychiatric: Mood and affect are normal for age. Speech and behavior are normal.   ____________________________________________   LABS (all labs ordered are listed, but only abnormal results are displayed)  Labs Reviewed - No data to  display ____________________________________________  EKG   ____________________________________________  RADIOLOGY Unk Pinto, personally viewed and evaluated these images (plain radiographs) as part of my medical decision making, as well as reviewing the written report by the radiologist.  CT ABDOMEN PELVIS WO CONTRAST  Result Date: 05/22/2019 CLINICAL DATA:  Abdominal trauma. Fell in the shower. Pain in left mid back. EXAM: CT ABDOMEN  AND PELVIS WITHOUT CONTRAST TECHNIQUE: Multidetector CT imaging of the abdomen and pelvis was performed following the standard protocol without IV contrast. COMPARISON:  CT 12/03/2017 FINDINGS: Lower chest: No basilar pneumothorax or pleural fluid. Heart is normal in size. Hepatobiliary: Diffusely decreased hepatic density consistent with steatosis. No perihepatic hematoma. Lack of IV contrast limits assessment for focal injury, no evidence of laceration or hematoma. Geographic area of low-density in the subcapsular right lobe may represent an area of focal fatty sparing, however was not seen on 2019 exam and is nonspecific. Gallbladder is surgically absent. No biliary dilatation. Pancreas: Parenchymal atrophy. No evidence of injury. No ductal dilatation or inflammation. Spleen: Normal in size. No perisplenic hematoma. No evidence of injury allowing for lack of IV contrast. Adrenals/Urinary Tract: No adrenal hemorrhage. No evidence of renal injury. Nonobstructing 11 mm stone in the lower right kidney. No hydronephrosis. No ureteral calculi. Urinary bladder is partially distended, no evidence of bladder injury. Stomach/Bowel: No bowel wall thickening or inflammatory change. No evidence of bowel injury or mesenteric hematoma. Ingested material within the stomach. No bowel obstruction. Distal colonic diverticulosis without diverticulitis. Normal appendix. Vascular/Lymphatic: Abdominal aorta is normal in caliber. Aortic atherosclerosis. No retroperitoneal fluid or  stranding to suggest injury. Prominent portal caval node at 12 mm, unchanged from prior exam. Reproductive: Small posterior uterine calcification most consistent with fibroid. No adnexal mass. Other: No free air or free fluid. Patchy subcutaneous soft tissue edema within the left posterior flank, series 2, image 25. No confluent hematoma. Musculoskeletal: Nondisplaced fracture left lateral tenth rib fracture. No acute fracture of the lumbar spine or pelvis. IMPRESSION: 1. Nondisplaced left lateral tenth rib fracture. Patchy subcutaneous soft tissue edema in the left posterior flank. 2. No additional acute traumatic injury to the abdomen or pelvis. 3. Hepatic steatosis. Area of geographic increased density in the subcapsular right lobe may represent an area of focal fatty sparing, however was not seen on 2019 exam. No evidence of adjacent fluid or injury. Recommend nonemergent follow-up MRI characterization on an elective outpatient basis. 4. Nonobstructing right renal stone. Colonic diverticulosis without diverticulitis. Aortic Atherosclerosis (ICD10-I70.0). Electronically Signed   By: Keith Rake M.D.   On: 05/22/2019 22:48   DG Ribs Unilateral W/Chest Left  Result Date: 05/22/2019 CLINICAL DATA:  Fall with rib pain. EXAM: LEFT RIBS AND CHEST - 3+ VIEW COMPARISON:  Chest radiograph dated 05/21/2018 FINDINGS: No fracture or other bone lesions are seen involving the ribs. There is no evidence of pneumothorax or pleural effusion. Both lungs are clear. Heart size and mediastinal contours are within normal limits. A right shoulder hemiarthroplasty is noted. IMPRESSION: Negative. Electronically Signed   By: Zerita Boers M.D.   On: 05/22/2019 20:16   CT Head Wo Contrast  Result Date: 05/22/2019 CLINICAL DATA:  [place] in the shower with trauma to the head and neck. EXAM: CT HEAD WITHOUT CONTRAST CT CERVICAL SPINE WITHOUT CONTRAST TECHNIQUE: Multidetector CT imaging of the head and cervical spine was performed  following the standard protocol without intravenous contrast. Multiplanar CT image reconstructions of the cervical spine were also generated. COMPARISON:  02/01/2008 FINDINGS: CT HEAD FINDINGS Brain: Mild generalized atrophy. Mild small vessel changes of the white matter, most notable in the right frontal white matter. No large vessel territory insult. No mass lesion, hemorrhage, hydrocephalus or extra-axial collection. Vascular: No abnormal vascular finding. Skull: No skull fracture. Sinuses/Orbits: Small amount of fluid in the left division of the sphenoid sinus. Other sinuses are clear. Old medial orbital blowout fracture  on the right. Orbits negative. Other: None CT CERVICAL SPINE FINDINGS Alignment: Normal Skull base and vertebrae: Previous ACDF C3 through C5 appears solid without complicating feature. Soft tissues and spinal canal: No traumatic soft tissue finding. Disc levels: Foramen magnum widely patent. Ordinary mild osteoarthritis of the C1-2 articulation without encroachment upon the neural structures. C2-3: Normal C3 through C5: Good appearance following previous ACDF. C5-6: Mild bilateral uncovertebral hypertrophy. Moderate bilateral facet degeneration right more than left. No central canal stenosis. Bilateral bony foraminal narrowing, right more than. C6-7: Mild disc bulge.  No compressive stenosis. C7-T1 and T1-T2: Normal Upper chest: Negative Other: None IMPRESSION: Head CT: No acute or traumatic finding. Minor small vessel changes of the white matter, most notable in the right frontal region. Cervical spine CT: No acute or traumatic finding. Previous ACDF C3 through C5 has a good appearance. Adjacent segment degenerative changes at C5-6 bilateral foraminal narrowing because of osteophytic encroachment, right more than left. Electronically Signed   By: Nelson Chimes M.D.   On: 05/22/2019 22:08   CT Cervical Spine Wo Contrast  Result Date: 05/22/2019 CLINICAL DATA:  [place] in the shower with trauma  to the head and neck. EXAM: CT HEAD WITHOUT CONTRAST CT CERVICAL SPINE WITHOUT CONTRAST TECHNIQUE: Multidetector CT imaging of the head and cervical spine was performed following the standard protocol without intravenous contrast. Multiplanar CT image reconstructions of the cervical spine were also generated. COMPARISON:  02/01/2008 FINDINGS: CT HEAD FINDINGS Brain: Mild generalized atrophy. Mild small vessel changes of the white matter, most notable in the right frontal white matter. No large vessel territory insult. No mass lesion, hemorrhage, hydrocephalus or extra-axial collection. Vascular: No abnormal vascular finding. Skull: No skull fracture. Sinuses/Orbits: Small amount of fluid in the left division of the sphenoid sinus. Other sinuses are clear. Old medial orbital blowout fracture on the right. Orbits negative. Other: None CT CERVICAL SPINE FINDINGS Alignment: Normal Skull base and vertebrae: Previous ACDF C3 through C5 appears solid without complicating feature. Soft tissues and spinal canal: No traumatic soft tissue finding. Disc levels: Foramen magnum widely patent. Ordinary mild osteoarthritis of the C1-2 articulation without encroachment upon the neural structures. C2-3: Normal C3 through C5: Good appearance following previous ACDF. C5-6: Mild bilateral uncovertebral hypertrophy. Moderate bilateral facet degeneration right more than left. No central canal stenosis. Bilateral bony foraminal narrowing, right more than. C6-7: Mild disc bulge.  No compressive stenosis. C7-T1 and T1-T2: Normal Upper chest: Negative Other: None IMPRESSION: Head CT: No acute or traumatic finding. Minor small vessel changes of the white matter, most notable in the right frontal region. Cervical spine CT: No acute or traumatic finding. Previous ACDF C3 through C5 has a good appearance. Adjacent segment degenerative changes at C5-6 bilateral foraminal narrowing because of osteophytic encroachment, right more than left.  Electronically Signed   By: Nelson Chimes M.D.   On: 05/22/2019 22:08    ____________________________________________    PROCEDURES  Procedure(s) performed:     Procedures     Medications  oxyCODONE-acetaminophen (PERCOCET/ROXICET) 5-325 MG per tablet 1 tablet (1 tablet Oral Given 05/22/19 2119)  ondansetron (ZOFRAN-ODT) disintegrating tablet 4 mg (4 mg Oral Given 05/22/19 2119)     ____________________________________________   INITIAL IMPRESSION / ASSESSMENT AND PLAN / ED COURSE  Pertinent labs & imaging results that were available during my care of the patient were reviewed by me and considered in my medical decision making (see chart for details).      Assessment and Plan: Fall:  74 year old  female presents to the emergency department after a mechanical fall in her shower.  Patient was mildly hypertensive at triage but vital signs were otherwise reassuring.  She did have some left upper quadrant tenderness to palpation with associated guarding and appeared uncomfortable.  Differential diagnosis originally included subdural hematoma, skull fracture, splenic laceration, rib fracture, pneumothorax..  CT head and CT cervical spine revealed no acute abnormality.  CT abdomen revealed a nondisplaced 10th rib fracture but no other acute abnormality.  X-ray examination of the left ribs and chest revealed no acute abnormality and no evidence of pneumothorax.  EKG indicated a left bundle branch block which is consistent with prior EKGs.  Patient was given Percocet in the emergency department for pain.  She was discharged with a short course of Percocet.  An incidental finding of fatty sparing was identified on patient's liver and radiologist recommended patient have an outpatient MRI of her liver.  Incidental results were conveyed to patient and she stated that she would schedule outpatient MRI.   ____________________________________________  FINAL CLINICAL IMPRESSION(S) / ED  DIAGNOSES  Final diagnoses:  Fall, initial encounter  Closed fracture of one rib of left side, initial encounter      NEW MEDICATIONS STARTED DURING THIS VISIT:  ED Discharge Orders         Ordered    oxyCODONE-acetaminophen (PERCOCET/ROXICET) 5-325 MG tablet  Every 6 hours PRN     05/22/19 2300    ondansetron (ZOFRAN) 4 MG tablet  Every 8 hours PRN     05/22/19 2300              This chart was dictated using voice recognition software/Dragon. Despite best efforts to proofread, errors can occur which can change the meaning. Any change was purely unintentional.     Lannie Fields, PA-C 05/22/19 2306    Lilia Pro., MD 05/23/19 1226

## 2019-05-24 ENCOUNTER — Other Ambulatory Visit: Payer: Self-pay | Admitting: Internal Medicine

## 2019-05-24 DIAGNOSIS — K7689 Other specified diseases of liver: Secondary | ICD-10-CM

## 2019-06-01 ENCOUNTER — Other Ambulatory Visit: Payer: Self-pay

## 2019-06-01 ENCOUNTER — Ambulatory Visit
Admission: RE | Admit: 2019-06-01 | Discharge: 2019-06-01 | Disposition: A | Discharge status: Home / Self Care | Payer: Medicare Other | Source: Ambulatory Visit | Attending: Internal Medicine | Admitting: Internal Medicine

## 2019-06-01 DIAGNOSIS — K7689 Other specified diseases of liver: Secondary | ICD-10-CM | POA: Diagnosis present

## 2019-06-01 MED ORDER — GADOBUTROL 1 MMOL/ML IV SOLN
8.0000 mL | Freq: Once | INTRAVENOUS | Status: AC | PRN
  Administered 2019-06-01: 8 mL via INTRAVENOUS

## 2019-09-09 ENCOUNTER — Other Ambulatory Visit: Payer: Self-pay | Admitting: Internal Medicine

## 2019-09-09 DIAGNOSIS — K7689 Other specified diseases of liver: Secondary | ICD-10-CM

## 2019-09-22 ENCOUNTER — Ambulatory Visit
Admission: RE | Admit: 2019-09-22 | Discharge: 2019-09-22 | Disposition: A | Discharge status: Home / Self Care | Payer: Medicare Other | Source: Ambulatory Visit | Attending: Internal Medicine | Admitting: Internal Medicine

## 2019-09-22 ENCOUNTER — Other Ambulatory Visit: Payer: Self-pay

## 2019-09-22 DIAGNOSIS — K7689 Other specified diseases of liver: Secondary | ICD-10-CM | POA: Diagnosis not present

## 2019-09-22 MED ORDER — GADOXETATE DISODIUM 0.25 MMOL/ML IV SOLN
8.0000 mL | Freq: Once | INTRAVENOUS | Status: AC | PRN
  Administered 2019-09-22: 8 mL via INTRAVENOUS

## 2019-10-02 ENCOUNTER — Other Ambulatory Visit: Payer: Self-pay | Admitting: Urology

## 2019-10-08 ENCOUNTER — Encounter: Payer: Self-pay | Admitting: Urology

## 2019-10-18 ENCOUNTER — Other Ambulatory Visit: Payer: Self-pay | Admitting: Urology

## 2019-10-25 ENCOUNTER — Other Ambulatory Visit: Payer: Self-pay | Admitting: Surgery

## 2019-10-25 DIAGNOSIS — Z96611 Presence of right artificial shoulder joint: Secondary | ICD-10-CM

## 2019-10-25 DIAGNOSIS — M7581 Other shoulder lesions, right shoulder: Secondary | ICD-10-CM

## 2019-11-01 ENCOUNTER — Other Ambulatory Visit: Payer: Self-pay | Admitting: Internal Medicine

## 2019-11-01 DIAGNOSIS — Z1231 Encounter for screening mammogram for malignant neoplasm of breast: Secondary | ICD-10-CM

## 2019-11-01 DIAGNOSIS — M7989 Other specified soft tissue disorders: Secondary | ICD-10-CM

## 2019-11-05 ENCOUNTER — Other Ambulatory Visit: Payer: Medicare Other

## 2019-11-05 ENCOUNTER — Ambulatory Visit: Payer: Medicare Other

## 2019-11-10 ENCOUNTER — Ambulatory Visit
Admission: RE | Admit: 2019-11-10 | Discharge: 2019-11-10 | Disposition: A | Discharge status: Home / Self Care | Payer: Medicare Other | Source: Ambulatory Visit | Attending: Internal Medicine | Admitting: Internal Medicine

## 2019-11-10 ENCOUNTER — Other Ambulatory Visit: Payer: Self-pay

## 2019-11-10 ENCOUNTER — Ambulatory Visit
Admission: RE | Admit: 2019-11-10 | Discharge: 2019-11-10 | Disposition: A | Discharge status: Home / Self Care | Payer: Medicare Other | Source: Ambulatory Visit | Attending: Surgery | Admitting: Surgery

## 2019-11-10 DIAGNOSIS — M7581 Other shoulder lesions, right shoulder: Secondary | ICD-10-CM

## 2019-11-10 DIAGNOSIS — M7989 Other specified soft tissue disorders: Secondary | ICD-10-CM | POA: Insufficient documentation

## 2019-11-10 DIAGNOSIS — Z96611 Presence of right artificial shoulder joint: Secondary | ICD-10-CM | POA: Diagnosis present

## 2019-11-19 ENCOUNTER — Other Ambulatory Visit: Payer: Self-pay

## 2019-11-19 ENCOUNTER — Ambulatory Visit
Admission: RE | Admit: 2019-11-19 | Discharge: 2019-11-19 | Disposition: A | Discharge status: Home / Self Care | Payer: Medicare Other | Source: Ambulatory Visit | Attending: Internal Medicine | Admitting: Internal Medicine

## 2019-11-19 DIAGNOSIS — Z1231 Encounter for screening mammogram for malignant neoplasm of breast: Secondary | ICD-10-CM | POA: Diagnosis present

## 2019-12-20 ENCOUNTER — Ambulatory Visit: Payer: Medicare Other | Admitting: Urology

## 2019-12-22 ENCOUNTER — Other Ambulatory Visit: Payer: Self-pay | Admitting: Urology

## 2019-12-22 ENCOUNTER — Ambulatory Visit (INDEPENDENT_AMBULATORY_CARE_PROVIDER_SITE_OTHER): Payer: Medicare Other | Admitting: Urology

## 2019-12-22 ENCOUNTER — Encounter: Payer: Self-pay | Admitting: Urology

## 2019-12-22 ENCOUNTER — Other Ambulatory Visit: Payer: Self-pay

## 2019-12-22 ENCOUNTER — Ambulatory Visit
Admission: RE | Admit: 2019-12-22 | Discharge: 2019-12-22 | Disposition: A | Discharge status: Home / Self Care | Payer: Medicare Other | Attending: Urology | Admitting: Urology

## 2019-12-22 ENCOUNTER — Ambulatory Visit
Admission: RE | Admit: 2019-12-22 | Discharge: 2019-12-22 | Disposition: A | Discharge status: Home / Self Care | Payer: Medicare Other | Source: Ambulatory Visit | Attending: Urology | Admitting: Urology

## 2019-12-22 VITALS — BP 149/88 | HR 66 | Ht 65.0 in | Wt 185.0 lb

## 2019-12-22 DIAGNOSIS — N200 Calculus of kidney: Secondary | ICD-10-CM | POA: Insufficient documentation

## 2019-12-22 DIAGNOSIS — R102 Pelvic and perineal pain unspecified side: Secondary | ICD-10-CM

## 2019-12-22 DIAGNOSIS — N3281 Overactive bladder: Secondary | ICD-10-CM

## 2019-12-22 NOTE — Progress Notes (Signed)
° °  12/22/2019 10:10 AM   Danielle Richardson Jan 07, 1946 [id number]  Reason for visit: Follow up nephrolithiasis, overactive bladder, pelvic pain  HPI: I saw [name] in urology clinic for follow-up of the above issues.  She is a 74 year old female who underwent a microscopic hematuria work-up in September 2019 that showed an asymptomatic 1.4 cm right lower pole nonobstructing stone and normal cystoscopy.  She opted for observation for the stone.  I personally reviewed her KUB today that shows no significant change in size and her nonobstructing right-sided stone.  She denies any flank pain or gross hematuria, or recurrent UTIs.  She also has a history of an overactive bladder and has been on oxybutynin daily, but has bothersome side effects of dry mouth.  She denies any significant urinary symptoms on this medication.  She is interested in changing back to Myrbetriq secondary to side effects from oxybutynin.  I recommended stopping the oxybutynin and see how her urinary symptoms are, and I gave her samples of Myrbetriq 25 mg daily to resume if she has recurrence of her bothersome OAB symptoms.  She also has a history of pelvic/vaginal pain that was previously evaluated by GYN and had no improvement with lidocaine gel or Valium suppositories.  She was unable to follow-up with pelvic floor physical therapy secondary to cost.  She has noticed significant improvement in her pelvic pain over the last year or so by managing her constipation better with MiraLAX.  Stop oxybutynin, trial off medications and see how OAB symptoms are at 1 month virtual visit, Myrbetriq samples provided Continue 1 year KUB for surveillance of right lower pole stone  Billey Co, MD  [facility] [address], [place], [place] [address] [phone]

## 2019-12-22 NOTE — Patient Instructions (Signed)
Stop oxybutynin and see how your urinary symptoms respond over the next month.  If you have worsening urgency, frequency, or leakage, okay to start the Myrbetriq 25 mg daily samples   Overactive Bladder, Adult  Overactive bladder refers to a condition in which a person has a sudden need to pass urine. The person may leak urine if he or she cannot get to the bathroom fast enough (urinary incontinence). A person with this condition may also wake up several times in the night to go to the bathroom. Overactive bladder is associated with poor nerve signals between your bladder and your brain. Your bladder may get the signal to empty before it is full. You may also have very sensitive muscles that make your bladder squeeze too soon. These symptoms might interfere with daily work or social activities. What are the causes? This condition may be associated with or caused by:  Urinary tract infection.  Infection of nearby tissues, such as the prostate.  Prostate enlargement.  Surgery on the uterus or urethra.  Bladder stones, inflammation, or tumors.  Drinking too much caffeine or alcohol.  Certain medicines, especially medicines that get rid of extra fluid in the body (diuretics).  Muscle or nerve weakness, especially from: ? A spinal cord injury. ? Stroke. ? Multiple sclerosis. ? Parkinson's disease.  Diabetes.  Constipation. What increases the risk? You may be at greater risk for overactive bladder if you:  Are an older adult.  Smoke.  Are going through menopause.  Have prostate problems.  Have a neurological disease, such as stroke, dementia, Parkinson's disease, or multiple sclerosis (MS).  Eat or drink things that irritate the bladder. These include alcohol, spicy food, and caffeine.  Are overweight or obese. What are the signs or symptoms? Symptoms of this condition include:  Sudden, strong urge to urinate.  Leaking urine.  Urinating 8 or more times a  day.  Waking up to urinate 2 or more times a night. How is this diagnosed? Your health care provider may suspect overactive bladder based on your symptoms. He or she will diagnose this condition by:  A physical exam and medical history.  Blood or urine tests. You might need bladder or urine tests to help determine what is causing your overactive bladder. You might also need to see a health care provider who specializes in urinary tract problems (urologist). How is this treated? Treatment for overactive bladder depends on the cause of your condition and whether it is mild or severe. You can also make lifestyle changes at home. Options include:  Bladder training. This may include: ? Learning to control the urge to urinate by following a schedule that directs you to urinate at regular intervals (timed voiding). ? Doing Kegel exercises to strengthen your pelvic floor muscles, which support your bladder. Toning these muscles can help you control urination, even if your bladder muscles are overactive.  Special devices. This may include: ? Biofeedback, which uses sensors to help you become aware of your body's signals. ? Electrical stimulation, which uses electrodes placed inside the body (implanted) or outside the body. These electrodes send gentle pulses of electricity to strengthen the nerves or muscles that control the bladder. ? Women may use a plastic device that fits into the vagina and supports the bladder (pessary).  Medicines. ? Antibiotics to treat bladder infection. ? Antispasmodics to stop the bladder from releasing urine at the wrong time. ? Tricyclic antidepressants to relax bladder muscles. ? Injections of botulinum toxin type A directly into  the bladder tissue to relax bladder muscles.  Lifestyle changes. This may include: ? Weight loss. Talk to your health care provider about weight loss methods that would work best for you. ? Diet changes. This may include reducing how much  alcohol and caffeine you consume, or drinking fluids at different times of the day. ? Not smoking. Do not use any products that contain nicotine or tobacco, such as cigarettes and e-cigarettes. If you need help quitting, ask your health care provider.  Surgery. ? A device may be implanted to help manage the nerve signals that control urination. ? An electrode may be implanted to stimulate electrical signals in the bladder. ? A procedure may be done to change the shape of the bladder. This is done only in very severe cases. Follow these instructions at home: Lifestyle  Make any diet or lifestyle changes that are recommended by your health care provider. These may include: ? Drinking less fluid or drinking fluids at different times of the day. ? Cutting down on caffeine or alcohol. ? Doing Kegel exercises. ? Losing weight if needed. ? Eating a healthy and balanced diet to prevent constipation. This may include:  Eating foods that are high in fiber, such as fresh fruits and vegetables, whole grains, and beans.  Limiting foods that are high in fat and processed sugars, such as fried and sweet foods. General instructions  Take over-the-counter and prescription medicines only as told by your health care provider.  If you were prescribed an antibiotic medicine, take it as told by your health care provider. Do not stop taking the antibiotic even if you start to feel better.  Use any implants or pessary as told by your health care provider.  If needed, wear pads to absorb urine leakage.  Keep a journal or log to track how much and when you drink and when you feel the need to urinate. This will help your health care provider monitor your condition.  Keep all follow-up visits as told by your health care provider. This is important. Contact a health care provider if:  You have a fever.  Your symptoms do not get better with treatment.  Your pain and discomfort get worse.  You have more  frequent urges to urinate. Get help right away if:  You are not able to control your bladder. Summary  Overactive bladder refers to a condition in which a person has a sudden need to pass urine.  Several conditions may lead to an overactive bladder.  Treatment for overactive bladder depends on the cause and severity of your condition.  Follow your health care provider's instructions about lifestyle changes, doing Kegel exercises, keeping a journal, and taking medicines. This information is not intended to replace advice given to you by your health care provider. Make sure you discuss any questions you have with your health care provider. Document Revised: 07/09/2018 Document Reviewed: 04/03/2017 Elsevier Patient Education  [place].

## 2020-01-19 ENCOUNTER — Telehealth (INDEPENDENT_AMBULATORY_CARE_PROVIDER_SITE_OTHER): Payer: Medicare Other | Admitting: Urology

## 2020-01-19 ENCOUNTER — Other Ambulatory Visit: Payer: Self-pay

## 2020-01-19 DIAGNOSIS — R102 Pelvic and perineal pain: Secondary | ICD-10-CM

## 2020-01-19 DIAGNOSIS — N3281 Overactive bladder: Secondary | ICD-10-CM

## 2020-01-19 DIAGNOSIS — N200 Calculus of kidney: Secondary | ICD-10-CM | POA: Diagnosis not present

## 2020-01-19 NOTE — Progress Notes (Signed)
Virtual Visit via Telephone Note  I connected with Danielle Richardson on 01/19/20 at  3:00 PM EDT by telephone and verified that I am speaking with the correct person using two identifiers.   Patient location: Home Provider location: [facility]   I discussed the limitations, risks, security and privacy concerns of performing an evaluation and management service by telephone and the availability of in person appointments. We discussed the impact of the COVID-19 pandemic on the healthcare system, and the importance of social distancing and reducing patient and provider exposure. I also discussed with the patient that there may be a patient responsible charge related to this service. The patient expressed understanding and agreed to proceed.  Reason for visit: Follow up nephrolithiasis, overactive bladder, pelvic pain  History of Present Illness: I had phone follow-up with Danielle Richardson today regarding her above issues.  Briefly, she is a 74 year old female with a history of microscopic hematuria work-up in summer 2019 that showed an asymptomatic 1.4 cm right lower pole nonobstructing stone and a normal cystoscopy, and she opted for observation.  I saw her last in September 2021 and KUB showed no change in size of her stone.  She has not had any recurrent UTIs or flank pain.  At our last visit, we opted to stop her oxybutynin for history of an overactive bladder secondary to bothersome side effects of dry mouth and constipation.  She stopped the oxybutynin and did not have any worsening of her urinary symptoms and denies any real urinary complaints at this time.  She also has a history of pelvic/vaginal pain in the past that has almost completely resolved since she has been managing her constipation better with MiraLAX.   Follow Up:  RTC 1 year for symptom check, KUB for surveillance of right lower pole stone Consider Myrbetriq in the future if worsening OAB symptoms   I discussed  the assessment and treatment plan with the patient. The patient was provided an opportunity to ask questions and all were answered. The patient agreed with the plan and demonstrated an understanding of the instructions.   The patient was advised to call back or seek an in-person evaluation if the symptoms worsen or if the condition fails to improve as anticipated.  I provided 12 minutes of non-face-to-face time during this encounter.   Billey Co, MD

## 2020-06-07 ENCOUNTER — Ambulatory Visit (INDEPENDENT_AMBULATORY_CARE_PROVIDER_SITE_OTHER): Payer: Medicare Other | Admitting: Dermatology

## 2020-06-07 ENCOUNTER — Other Ambulatory Visit: Payer: Self-pay

## 2020-06-07 ENCOUNTER — Encounter: Payer: Self-pay | Admitting: Dermatology

## 2020-06-07 DIAGNOSIS — L814 Other melanin hyperpigmentation: Secondary | ICD-10-CM

## 2020-06-07 DIAGNOSIS — D1800 Hemangioma unspecified site: Secondary | ICD-10-CM

## 2020-06-07 DIAGNOSIS — Z1283 Encounter for screening for malignant neoplasm of skin: Secondary | ICD-10-CM | POA: Diagnosis not present

## 2020-06-07 DIAGNOSIS — L820 Inflamed seborrheic keratosis: Secondary | ICD-10-CM | POA: Diagnosis not present

## 2020-06-07 DIAGNOSIS — L821 Other seborrheic keratosis: Secondary | ICD-10-CM | POA: Diagnosis not present

## 2020-06-07 DIAGNOSIS — L578 Other skin changes due to chronic exposure to nonionizing radiation: Secondary | ICD-10-CM | POA: Diagnosis not present

## 2020-06-07 DIAGNOSIS — D229 Melanocytic nevi, unspecified: Secondary | ICD-10-CM

## 2020-06-07 NOTE — Progress Notes (Signed)
   New Patient Visit  Subjective  Danielle Richardson is a 75 y.o. female who presents for the following: Annual Exam (New pt here for mole exam, no history of skin cancer, pt c/o of moles changing and irritating on her arms and legs ). The patient presents for Total-Body Skin Exam (TBSE) for skin cancer screening and mole check.  The following portions of the chart were reviewed this encounter and updated as appropriate:   Tobacco  Allergies  Meds  Problems  Med Hx  Surg Hx  Fam Hx     Review of Systems:  No other skin or systemic complaints except as noted in HPI or Assessment and Plan.  Objective  Well appearing patient in no apparent distress; mood and affect are within normal limits.  A full examination was performed including scalp, head, eyes, ears, nose, lips, neck, chest, axillae, abdomen, back, buttocks, bilateral upper extremities, bilateral lower extremities, hands, feet, fingers, toes, fingernails, and toenails. All findings within normal limits unless otherwise noted below.  Objective  Left Medial Thigh: Erythematous keratotic or waxy stuck-on papule or plaque.    Assessment & Plan  Inflamed seborrheic keratosis Left Medial Thigh  Destruction of lesion - Left Medial Thigh Complexity: simple   Destruction method: cryotherapy   Informed consent: discussed and consent obtained   Timeout:  patient name, date of birth, surgical site, and procedure verified Lesion destroyed using liquid nitrogen: Yes   Region frozen until ice ball extended beyond lesion: Yes   Outcome: patient tolerated procedure well with no complications   Post-procedure details: wound care instructions given    Skin cancer screening   Lentigines - Scattered tan macules - Due to sun exposure - Benign-appering, observe - Recommend daily broad spectrum sunscreen SPF 30+ to sun-exposed areas, reapply every 2 hours as needed. - Call for any changes  Seborrheic Keratoses - Stuck-on, waxy,  tan-brown papules and plaques  - Discussed benign etiology and prognosis. - Observe - Call for any changes  Melanocytic Nevi - Tan-brown and/or pink-flesh-colored symmetric macules and papules - Benign appearing on exam today - Observation - Call clinic for new or changing moles - Recommend daily use of broad spectrum spf 30+ sunscreen to sun-exposed areas.   Hemangiomas - Red papules - Discussed benign nature - Observe - Call for any changes  Actinic Damage - Chronic, secondary to cumulative UV/sun exposure - diffuse scaly erythematous macules with underlying dyspigmentation - Recommend daily broad spectrum sunscreen SPF 30+ to sun-exposed areas, reapply every 2 hours as needed.  - Call for new or changing lesions.  Skin cancer screening performed today.  Return in about 1 year (around 06/07/2021) for TBSE.  IMarye Round, CMA, am acting as scribe for Sarina Ser, MD .  Documentation: I have reviewed the above documentation for accuracy and completeness, and I agree with the above.  Sarina Ser, MD

## 2020-06-07 NOTE — Patient Instructions (Signed)

## 2020-08-16 ENCOUNTER — Other Ambulatory Visit: Payer: Self-pay | Admitting: Internal Medicine

## 2020-08-16 DIAGNOSIS — Z1231 Encounter for screening mammogram for malignant neoplasm of breast: Secondary | ICD-10-CM

## 2020-08-22 ENCOUNTER — Other Ambulatory Visit: Payer: Self-pay | Admitting: Urology

## 2020-11-20 ENCOUNTER — Ambulatory Visit
Admission: RE | Admit: 2020-11-20 | Discharge: 2020-11-20 | Disposition: A | Discharge status: Home / Self Care | Payer: Medicare Other | Source: Ambulatory Visit | Attending: Internal Medicine | Admitting: Internal Medicine

## 2020-11-20 ENCOUNTER — Other Ambulatory Visit: Payer: Self-pay

## 2020-11-20 DIAGNOSIS — Z1231 Encounter for screening mammogram for malignant neoplasm of breast: Secondary | ICD-10-CM | POA: Insufficient documentation

## 2021-01-18 ENCOUNTER — Ambulatory Visit (INDEPENDENT_AMBULATORY_CARE_PROVIDER_SITE_OTHER): Payer: Medicare Other | Admitting: Urology

## 2021-01-18 ENCOUNTER — Other Ambulatory Visit: Payer: Self-pay

## 2021-01-18 ENCOUNTER — Encounter: Payer: Self-pay | Admitting: Urology

## 2021-01-18 ENCOUNTER — Ambulatory Visit
Admission: RE | Admit: 2021-01-18 | Discharge: 2021-01-18 | Disposition: A | Discharge status: Home / Self Care | Payer: Medicare Other | Source: Ambulatory Visit | Attending: Urology | Admitting: Urology

## 2021-01-18 ENCOUNTER — Ambulatory Visit
Admission: RE | Admit: 2021-01-18 | Discharge: 2021-01-18 | Disposition: A | Discharge status: Home / Self Care | Payer: Medicare Other | Attending: Urology | Admitting: Urology

## 2021-01-18 VITALS — BP 135/74 | HR 69 | Ht 65.0 in | Wt 185.0 lb

## 2021-01-18 DIAGNOSIS — N393 Stress incontinence (female) (male): Secondary | ICD-10-CM

## 2021-01-18 DIAGNOSIS — N200 Calculus of kidney: Secondary | ICD-10-CM | POA: Diagnosis present

## 2021-01-18 DIAGNOSIS — N3281 Overactive bladder: Secondary | ICD-10-CM

## 2021-01-18 NOTE — Patient Instructions (Signed)
Kegel Exercises Kegel exercises can help strengthen your pelvic floor muscles. The pelvic floor is a group of muscles that support your rectum, small intestine, and bladder. In females, pelvic floor muscles also help support the womb (uterus). These muscles help you control the flow of urine and stool. Kegel exercises are painless and simple, and they do not require any equipment. Your provider may suggest Kegel exercises to: Improve bladder and bowel control. Improve sexual response. Improve weak pelvic floor muscles after surgery to remove the uterus (hysterectomy) or pregnancy (females). Improve weak pelvic floor muscles after prostate gland removal or surgery (males). Kegel exercises involve squeezing your pelvic floor muscles, which are the same muscles you squeeze when you try to stop the flow of urine or keep from passing gas. The exercises can be done while sitting, standing, or lying down, but it is best to vary your position. Exercises How to do Kegel exercises: Squeeze your pelvic floor muscles tight. You should feel a tight lift in your rectal area. If you are a female, you should also feel a tightness in your vaginal area. Keep your stomach, buttocks, and legs relaxed. Hold the muscles tight for up to 10 seconds. Breathe normally. Relax your muscles. Repeat as told by your health care provider. Repeat this exercise daily as told by your health care provider. Continue to do this exercise for at least 4-6 weeks, or for as long as told by your health care provider. You may be referred to a physical therapist who can help you learn more about how to do Kegel exercises. Depending on your condition, your health care provider may recommend: Varying how long you squeeze your muscles. Doing several sets of exercises every day. Doing exercises for several weeks. Making Kegel exercises a part of your regular exercise routine. This information is not intended to replace advice given to you by  your health care provider. Make sure you discuss any questions you have with your health care provider. Document Revised: 03/08/2020 Document Reviewed: 11/05/2017 Elsevier Patient Education  2022 [place].   How to Use a Vaginal Pessary A vaginal pessary is a removable device that is placed into your vagina to support pelvic organs that droop. These organs include your uterus, bladder, and rectum. When your pelvic organs drop down into your vagina, it causes a condition called pelvic organ prolapse (POP). A pessary may be an alternative to surgery for women with POP. It may help women who leak urine when they strain or exercise (stress incontinence). This is a symptom of POP. A vaginal pessary may also be a temporary treatment for stress incontinence during pregnancy. There are several types of pessaries. All types are usually made of silicone. You can insert and remove some on your own. Other types must be inserted and removed by your health care provider at office visits. The reason you are using a pessary and the severity of your condition will determine which one is best for you. It is also important to find the right size. A pessary that is too small may fall out. A pessary that is too large may cause pain or discomfort. Your health care provider will do a physical exam to find the correct size and fit for your pessary. It may take several appointments to find the best fit for you. If you can be fit with the type of pessary that you can insert, remove, and clean yourself, your health care provider will teach you how to use your pessary at  home. You may have checkups every few months. If you have the type of pessary that needs to be inserted and removed by your health care provider, you will have appointments every few months to have the pessary removed, cleaned, and replaced. What are the risks? When properly fitted and cared for, risks of using a vaginal pessary can be small. However, there  can be problems that may include: Vaginal discharge. Vaginal bleeding. A bad smell coming from your vagina. Scraping of the skin inside your vagina. How to use your pessary Follow your health care provider's instructions for using a pessary. These instructions may vary, depending on the type of pessary you have. To insert a pessary: Wash your hands with soap and water for at least 20 seconds. Squeeze or fold the pessary in half and lubricate the tip with a water-based lubricant. Insert the pessary into your vagina. It will unfold and provide support. To remove the pessary, gently tug it out of your vagina. You can remove the pessary every night or after several days. You can also remove it to have sex. How to care for your pessary If you have a pessary that you can remove: Clean your pessary with soap and water. Rinse well. Dry it completely before inserting it back into your vagina. Follow these instructions at home: Take over-the-counter and prescription medicines only as told by your health care provider. Your health care provider may prescribe an estrogen cream to moisten your vagina. Keep all follow-up visits. This is important. Contact a health care provider if: You feel any pain or discomfort when your pessary is in place. You continue to have stress incontinence. You have trouble keeping your pessary from falling out. You have an unusual vaginal discharge that is blood-tinged or smells bad. Summary A vaginal pessary is a removable device that is placed into your vagina to support pelvic organs that droop. This condition is called pelvic organ prolapse (POP). There are several types of pessaries. Some you can insert and remove on your own. Others must be inserted and removed by your health care provider. The best type for you depends on the reason you are using a pessary and the severity of your condition. It is also important to find the right size. If you can use the type that  you insert and remove on your own, your health care provider will teach you how to use it and schedule checkups every few months. If you have the type that needs to be inserted and removed by your health care provider, you will have regular appointments to have your pessary removed, cleaned, and replaced. This information is not intended to replace advice given to you by your health care provider. Make sure you discuss any questions you have with your health care provider. Document Revised: 09/16/2019 Document Reviewed: 09/16/2019 Elsevier Patient Education  [place].

## 2021-01-18 NOTE — Progress Notes (Signed)
   01/18/2021 2:17 PM   Shanty S [place] 05/18/1945 [id number]  Reason for visit: Follow up nephrolithiasis, incontinence, pelvic.  HPI: 75 year old female who underwent a microscopic hematuria work-up in September 2019 that showed an asymptomatic 1.4 cm right lower pole nonobstructing stone in normal cystoscopy.  She opted for observation.  I personally reviewed her KUB today that shows no significant change in size of her right-sided asymptomatic stone, and she would like to continue observation.  She previously was having problems with overactive bladder and urge incontinence, and was trialed on oxybutynin.  She ultimately discontinued this medication secondary to side effects of dry mouth, and her urinary symptoms of urgency and urge incontinence have essentially resolved.  Her primary urinary complaint currently is some stress incontinence when she coughs, laughs, or sneezes.  She reports she underwent a prior surgery for this over 20 years ago with a Dr. Davis Gourd.  We discussed a stepwise approach to stress incontinence including Kegel exercises, pessary via gynecology, or consideration of surgical options with a urogynecology specialist.  She also has a history of pelvic/vaginal pain that resolved with management of constipation, and this has not recurred.  RTC 1 year with KUB, if doing well at that time likely can follow-up as needed  Billey Co, [place] [address] [place], [place] [address] [phone]

## 2021-03-14 ENCOUNTER — Other Ambulatory Visit: Payer: Self-pay

## 2021-03-14 ENCOUNTER — Ambulatory Visit
Admission: EM | Admit: 2021-03-14 | Discharge: 2021-03-14 | Payer: Medicare Other | Attending: Emergency Medicine | Admitting: Emergency Medicine

## 2021-03-14 ENCOUNTER — Emergency Department
Admission: EM | Admit: 2021-03-14 | Discharge: 2021-03-15 | Disposition: A | Discharge status: Home / Self Care | Payer: Medicare Other | Attending: Emergency Medicine | Admitting: Emergency Medicine

## 2021-03-14 ENCOUNTER — Emergency Department: Payer: Medicare Other

## 2021-03-14 ENCOUNTER — Encounter: Payer: Self-pay | Admitting: Emergency Medicine

## 2021-03-14 DIAGNOSIS — Z79899 Other long term (current) drug therapy: Secondary | ICD-10-CM | POA: Insufficient documentation

## 2021-03-14 DIAGNOSIS — X501XXA Overexertion from prolonged static or awkward postures, initial encounter: Secondary | ICD-10-CM | POA: Insufficient documentation

## 2021-03-14 DIAGNOSIS — M79604 Pain in right leg: Secondary | ICD-10-CM | POA: Insufficient documentation

## 2021-03-14 DIAGNOSIS — Z7901 Long term (current) use of anticoagulants: Secondary | ICD-10-CM | POA: Diagnosis not present

## 2021-03-14 DIAGNOSIS — Z87891 Personal history of nicotine dependence: Secondary | ICD-10-CM | POA: Diagnosis not present

## 2021-03-14 DIAGNOSIS — I10 Essential (primary) hypertension: Secondary | ICD-10-CM | POA: Insufficient documentation

## 2021-03-14 DIAGNOSIS — S79921A Unspecified injury of right thigh, initial encounter: Secondary | ICD-10-CM | POA: Diagnosis present

## 2021-03-14 DIAGNOSIS — Z7952 Long term (current) use of systemic steroids: Secondary | ICD-10-CM | POA: Diagnosis not present

## 2021-03-14 DIAGNOSIS — S76311A Strain of muscle, fascia and tendon of the posterior muscle group at thigh level, right thigh, initial encounter: Secondary | ICD-10-CM | POA: Diagnosis not present

## 2021-03-14 DIAGNOSIS — J449 Chronic obstructive pulmonary disease, unspecified: Secondary | ICD-10-CM | POA: Diagnosis not present

## 2021-03-14 DIAGNOSIS — Z96611 Presence of right artificial shoulder joint: Secondary | ICD-10-CM | POA: Insufficient documentation

## 2021-03-14 MED ORDER — PREDNISONE 50 MG PO TABS: 50.0000 mg | ORAL_TABLET | Freq: Every day | ORAL | 0 refills | Status: DC

## 2021-03-14 MED ORDER — HYDROCODONE-ACETAMINOPHEN 5-325 MG PO TABS
1.0000 | ORAL_TABLET | ORAL | 0 refills | Status: DC | PRN
Start: 2021-03-14 — End: 2022-01-23

## 2021-03-14 MED ORDER — PREDNISONE 20 MG PO TABS
60.0000 mg | ORAL_TABLET | Freq: Once | ORAL | Status: DC
  Filled 2021-03-14: qty 3

## 2021-03-14 MED ORDER — HYDROCODONE-ACETAMINOPHEN 5-325 MG PO TABS
1.0000 | ORAL_TABLET | Freq: Once | ORAL | Status: AC
  Administered 2021-03-14: 1 via ORAL
  Filled 2021-03-14: qty 1

## 2021-03-14 NOTE — ED Notes (Signed)
Provider at bedside

## 2021-03-14 NOTE — Discharge Instructions (Addendum)
I am concerned that you could have ruptured a tendon at the insertion of your hamstring.  I am concerned that you are going to need pain control beyond Tylenol, which is all we have here..  Please go to the emergency department of your choice for further evaluation.

## 2021-03-14 NOTE — ED Provider Notes (Signed)
Patient presents with the acute onset of posterior sharp, stabbing right upper thigh pain starting today.  States that she was pivoting, followed by stabbing pain.  States that it feels like her leg is going to give out from underneath her due to the pain.  She is currently on Levaquin for an ear infection.  She has not tried anything for this.  There were no aggravating factors.  She denies fall, direct trauma.  She has never had any symptoms like this before.  She is on Eliquis for a DVT.  She also has a history of coronary disease, COPD, osteoporosis.  Today's Vitals   03/14/21 1808 03/14/21 1810  BP:  (!) 159/85  Pulse:  69  Resp:  (!) 118  Temp:  98.4 F (36.9 C)  TempSrc:  Oral  SpO2:  99%  PainSc: 10-Worst pain ever    There is no height or weight on file to calculate BMI.   Patient is in a significant amount of pain, crying.  She has tenderness at the insertion of the right hamstring.  No other thigh tenderness.  Patient is able to flex/extend at the knee.  Unfortunately, we do not have any analgesics other than Tylenol, and I am worried that she is going to need more pain control than what we can offer her here.  I am concerned that she has ruptured or partially ruptured the tendon at the insertion of the hamstring.  Transferring to the emergency department for further evaluation.  She is stable to go by private vehicle.  Discussed rationale for transfer to the emergency department with patient and with daughter.  They agree to go.   Melynda Ripple, MD 03/14/21 [phone]

## 2021-03-14 NOTE — ED Triage Notes (Signed)
Patient presents to Urgent Care with complaints of intermittent Right thigh pain since 330 today. She describes the pain as a stabbing pain. She states she called her PCP and was told her pain may be caused by the levaquin medication. She states she is unsure if related to the medication or from a pulled muscle. She states she was pivoting and felt a stabbing pain afterwards.   Denies chest pain or SOB.

## 2021-03-14 NOTE — ED Notes (Addendum)
Patient is being discharged from the Urgent Care and sent to the Emergency Department via POV . Per Alphonzo Cruise, MD patient is in need of higher level of care due to posterior leg pain and to rule out ruptured tendon. Patient is aware and verbalizes understanding of plan of care.  Vitals:   03/14/21 1810  BP: (!) 159/85  Pulse: 69  Resp: (!) 118  Temp: 98.4 F (36.9 C)  SpO2: 99%

## 2021-03-14 NOTE — ED Triage Notes (Signed)
Pt to ED from home c/o right leg pain today around 1530.  States pivoted at home and felt a stabbing pain behind upper right thigh.  States went to [facility] and was advised to come to ED for pain control and concern of tendon tear.  States has been levaquin for the last week for sinus/ear infection.  Has been able to bear weight but pain comes on suddenly and severe.

## 2021-03-14 NOTE — ED Provider Notes (Signed)
[facility] Emergency Department Provider Note  ____________________________________________  Time seen: Approximately 11:05 PM  I have reviewed the triage vital signs and the nursing notes.   HISTORY  Chief Complaint Leg Pain    HPI Danielle Richardson is a 75 y.o. female who presents the emergency department complaining of sharp hamstring pain.  Patient states that she was walking through her house, twisted slightly and felt a sudden sharp stabbing pain in her posterior thigh.  Patient states that it was not constant, seem to come in waves but when it was present it was severe.  Patient has had no bruising, no swelling.  She still ambulatory at this time.  There is no hip pain or knee pain associated with this.  This is mid thigh.  No history of previous hamstring injuries.  Patient went to urgent care and was referred to the emergency department given the level of pain.       Past Medical History:  Diagnosis Date   Arthritis    Cataract    Chronic obstructive pulmonary disease ([place]) 09/11/2013   Coronary artery disease involving native coronary artery of native heart 09/23/2016   Overview:  Minimal by cath 2017   GERD (gastroesophageal reflux disease)    HLD (hyperlipidemia) 09/11/2013   Joint pain    Left bundle branch block    A-fib, Left BBB   Migraine    Pelvic pain in female    SUI (stress urinary incontinence, female)    Vertigo    Vertigo     Patient Active Problem List   Diagnosis Date Noted   History of colonic polyps    Benign neoplasm of ascending colon    Diverticulosis of large intestine without diverticulitis    Benign neoplasm of cecum    Atrial fibrillation ([place]) 05/21/2018   Trigger point, vagina 02/11/2018   Benign essential HTN 09/23/2016   Coronary artery disease involving native coronary artery of native heart 09/23/2016   Unstable angina ([place]) 08/31/2015   Complete left bundle branch block 08/31/2015   Endometrial polyp  06/01/2015   Uterine fibroid 05/23/2015   Gastric catarrh 04/25/2015   Climacteric 04/25/2015   Chest pain, non-cardiac 04/25/2015   Arthritis, degenerative 04/25/2015   Mixed incontinence urge and stress 04/25/2015   Dyspareunia, female 04/25/2015   Acute frontal sinusitis 03/30/2014   Other synovitis and tenosynovitis, right shoulder 03/30/2014   History of artificial joint 03/30/2014   Right supraspinatus tenosynovitis 03/30/2014   Common migraine with intractable migraine 10/19/2013   Allergic rhinitis 09/11/2013   Chronic obstructive pulmonary disease ([place]) 09/11/2013   Acid reflux 09/11/2013   BP (high blood pressure) 09/11/2013   History of migraine headaches 09/11/2013   HLD (hyperlipidemia) 09/11/2013   OP (osteoporosis) 09/11/2013   Awareness of heartbeats 09/11/2013    Past Surgical History:  Procedure Laterality Date   AUGMENTATION MAMMAPLASTY Bilateral    Pt had implants and then removed   bladder tack  1999   CARDIAC CATHETERIZATION N/A 09/12/2015   Procedure: Left Heart Cath and Coronary Angiography;  Surgeon: Corey Skains, MD;  Location: [place] [facility];  Service: Cardiovascular;  Laterality: N/A;   CATARACT EXTRACTION W/ INTRAOCULAR LENS  IMPLANT, BILATERAL  2018   CHOLECYSTECTOMY  1994   COLONOSCOPY WITH PROPOFOL N/A 02/25/2017   Procedure: COLONOSCOPY WITH PROPOFOL;  Surgeon: Virgel Manifold, MD;  Location: [place];  Service: Endoscopy;  Laterality: N/A;   COLONOSCOPY WITH PROPOFOL N/A 06/17/2018   Procedure:  COLONOSCOPY WITH PROPOFOL;  Surgeon: Virgel Manifold, MD;  Location: [facility];  Service: Endoscopy;  Laterality: N/A;   ESOPHAGOGASTRODUODENOSCOPY (EGD) WITH PROPOFOL N/A 02/25/2017   Procedure: ESOPHAGOGASTRODUODENOSCOPY (EGD) WITH PROPOFOL;  Surgeon: Virgel Manifold, MD;  Location: [place];  Service: Endoscopy;  Laterality: N/A;   EYE SURGERY     HYSTEROSCOPY WITH D & C N/A 01/08/2016   Procedure:  DILATATION AND CURETTAGE /HYSTEROSCOPY;  Surgeon: Brayton Mars, MD;  Location: [facility];  Service: Gynecology;  Laterality: N/A;   JOINT REPLACEMENT  2010   right shoulder   NECK SURGERY  2010   Cervical Discectomy with fusion & plating   SHOULDER SURGERY Right 2010   Total Shoulder Replacement, [facility]   TOE SURGERY      Prior to Admission medications   Medication Sig Start Date End Date Taking? Authorizing Provider  HYDROcodone-acetaminophen (NORCO/VICODIN) 5-325 MG tablet Take 1 tablet by mouth every 4 (four) hours as needed for severe pain. 03/14/21 03/14/22 Yes Zahli Vetsch, Charline Bills, PA-C  predniSONE (DELTASONE) 50 MG tablet Take 1 tablet (50 mg total) by mouth daily with breakfast. 03/14/21  Yes Shahzad Thomann, Charline Bills, PA-C  albuterol (PROVENTIL HFA;VENTOLIN HFA) 108 (90 Base) MCG/ACT inhaler Inhale 2 puffs into the lungs every 6 (six) hours as needed for wheezing or shortness of breath.    [provider]  Calcium-Vitamin D-Vitamin K 500-100-40 MG-UNT-MCG CHEW Chew 1 Dose by mouth daily.    [provider]  chlorpheniramine-HYDROcodone (Petroleum) 10-8 MG/5ML SUER Take by mouth. 03/07/21 03/17/21  [provider]  conjugated estrogens (PREMARIN) vaginal cream Place 2.13 Applicatorfuls vaginally 2 (two) times a week. 01/20/17   Defrancesco, [name], MD  diltiazem (CARDIZEM CD) 180 MG 24 hr capsule Take 180 mg by mouth daily. 09/28/20   [provider]  ELIQUIS 5 MG TABS tablet Take 5 mg by mouth 2 (two) times daily. 10/30/20   [provider]  fluticasone (FLONASE) 50 MCG/ACT nasal spray Place 2 sprays into the nose at bedtime.  03/28/15   [provider]  levofloxacin (LEVAQUIN) 500 MG tablet Take 500 mg by mouth daily. 03/07/21   [provider]  meclizine (ANTIVERT) 25 MG tablet Take 25 mg by mouth 3 (three) times daily as needed for dizziness.    [provider]  metoprolol succinate (TOPROL-XL) 100 MG  24 hr tablet Take 1 tablet (100 mg total) by mouth daily. 05/22/18   Max Sane, MD  Multiple Vitamins-Minerals (MULTIVITAMIN GUMMIES ADULT PO) Take 1 Dose by mouth daily.    [provider]  oxybutynin (DITROPAN) 5 MG tablet TAKE 1 TABLET BY MOUTH AT  BEDTIME 08/23/20   Billey Co, MD  oxyCODONE (OXY IR/ROXICODONE) 5 MG immediate release tablet  10/27/17   [provider]  pantoprazole (PROTONIX) 40 MG tablet Take 40 mg by mouth 2 (two) times daily.  03/28/15   [provider]  polyethylene glycol powder (GLYCOLAX/MIRALAX) powder Take by mouth as needed.  09/14/13   [provider]  rizatriptan (MAXALT) 10 MG tablet Take 10 mg by mouth as needed.     [provider]  rosuvastatin (CRESTOR) 5 MG tablet Take 5 mg by mouth daily.    [provider]    Allergies Penicillins, Ivp dye [iodinated diagnostic agents], Pravastatin, Alendronate, Codeine, Oseltamivir, and Rofecoxib  Family History  Problem Relation Age of Onset   Diabetes Father    Diabetes Brother    Colon cancer Maternal Uncle  Ovarian cancer Maternal Grandfather    Breast cancer Neg Hx     Social History Social History   Tobacco Use   Smoking status: Former    Types: Cigarettes    Quit date: 04/02/1999    Years since quitting: 21.9   Smokeless tobacco: Never  Vaping Use   Vaping Use: Never used  Substance Use Topics   Alcohol use: Not Currently   Drug use: No     Review of Systems  Constitutional: No fever/chills Eyes: No visual changes. No discharge ENT: No upper respiratory complaints. Cardiovascular: no chest pain. Respiratory: no cough. No SOB. Gastrointestinal: No abdominal pain.  No nausea, no vomiting.  No diarrhea.  No constipation. Musculoskeletal: Right hamstring pain Skin: Negative for rash, abrasions, lacerations, ecchymosis. Neurological: Negative for headaches, focal weakness or numbness.  10 System ROS otherwise  negative.  ____________________________________________   PHYSICAL EXAM:  VITAL SIGNS: ED Triage Vitals  Enc Vitals Group     BP 03/14/21 1939 (!) 165/91     Pulse Rate 03/14/21 1939 68     Resp 03/14/21 1939 18     Temp 03/14/21 1939 98.4 F (36.9 C)     Temp Source 03/14/21 1939 Oral     SpO2 03/14/21 1939 97 %     Weight 03/14/21 1940 175 lb (79.4 kg)     Height 03/14/21 1940 5\' 5"  (1.651 m)     Head Circumference --      Peak Flow --      Pain Score 03/14/21 1940 10     Pain Loc --      Pain Edu? --      Excl. in [place]? --      Constitutional: Alert and oriented. Well appearing and in no acute distress. Eyes: Conjunctivae are normal. PERRL. EOMI. Head: Atraumatic. ENT:      Ears:       Nose: No congestion/rhinnorhea.      Mouth/Throat: Mucous membranes are moist.  Neck: No stridor.    Cardiovascular: Normal rate, regular rhythm. Normal S1 and S2.  Good peripheral circulation. Respiratory: Normal respiratory effort without tachypnea or retractions. Lungs CTAB. Good air entry to the bases with no decreased or absent breath sounds. Musculoskeletal: Full range of motion to all extremities. No gross deformities appreciated.  Visualization of the right leg reveals no edema, ecchymosis, deformity to the right leg.  Able to have full range of motion to the hip and knee joint.  Palpation over the insertion points of the hamstring revealed no tenderness and no palpable abnormality or deficit.  There is no evidence of hamstring return correction along the proximal or distal thigh.  Patient is tender over the hamstring mid thigh without palpable findings. Neurologic:  Normal speech and language. No gross focal neurologic deficits are appreciated.  Skin:  Skin is warm, dry and intact. No rash noted. Psychiatric: Mood and affect are normal. Speech and behavior are normal. Patient exhibits appropriate insight and judgement.   ____________________________________________   LABS (all  labs ordered are listed, but only abnormal results are displayed)  Labs Reviewed - No data to display ____________________________________________  EKG   ____________________________________________  RADIOLOGY I personally viewed and evaluated these images as part of my medical decision making, as well as reviewing the written report by the radiologist.  ED Provider Interpretation: No evidence of acute findings on ultrasound or x-ray  US Venous Img Lower Unilateral Right  Result Date: 03/14/2021 CLINICAL DATA:  Right thigh pain EXAM: RIGHT  LOWER EXTREMITY VENOUS DOPPLER ULTRASOUND TECHNIQUE: Gray-scale sonography with compression, as well as color and duplex ultrasound, were performed to evaluate the deep venous system(s) from the level of the common femoral vein through the popliteal and proximal calf veins. COMPARISON:  None. FINDINGS: VENOUS Normal compressibility of the common femoral, superficial femoral, and popliteal veins, as well as the visualized calf veins. Visualized portions of profunda femoral vein and great saphenous vein unremarkable. No filling defects to suggest DVT on grayscale or color Doppler imaging. Doppler waveforms show normal direction of venous flow, normal respiratory plasticity and response to augmentation. Limited views of the contralateral common femoral vein are unremarkable. OTHER None. Limitations: none IMPRESSION: Negative. Electronically Signed   By: Rolm Baptise M.D.   On: 03/14/2021 21:10   DG Hip Unilat W or Wo Pelvis 2-3 Views Right  Result Date: 03/14/2021 CLINICAL DATA:  Right thigh pain EXAM: DG HIP (WITH OR WITHOUT PELVIS) 2-3V RIGHT COMPARISON:  None. FINDINGS: There is no evidence of hip fracture or dislocation. There is no evidence of arthropathy or other focal bone abnormality. IMPRESSION: Negative. Electronically Signed   By: Rolm Baptise M.D.   On: 03/14/2021 20:21     ____________________________________________    PROCEDURES  Procedure(s) performed:    Procedures    Medications  HYDROcodone-acetaminophen (NORCO/VICODIN) 5-325 MG per tablet 1 tablet (has no administration in time range)  predniSONE (DELTASONE) tablet 60 mg (has no administration in time range)     ____________________________________________   INITIAL IMPRESSION / ASSESSMENT AND PLAN / ED COURSE  Pertinent labs & imaging results that were available during my care of the patient were reviewed by me and considered in my medical decision making (see chart for details).  Review of the [place] CSRS was performed in accordance of the [place] prior to dispensing any controlled drugs.           Patient's diagnosis is consistent with hamstring strain.  Patient presents to the emergency department with pain in her hamstring region.  Patient twisted, felt a sudden sharp pain.  Pain has not been constant, seems to come in waves.  Currently patient has no pain.  She was referred from urgent care given the level of pain initially though again this has resolved without aggressive pain management.  Given the mechanism, imaging results and physical exam I suspect hamstring strain.  Patiently placed in knee immobilizer.  Limit amount of medications will be prescribed for the pain should it return.  If pain does increase, return she should follow-up with orthopedics for further management. Patient is given ED precautions to return to the ED for any worsening or new symptoms.     ____________________________________________  FINAL CLINICAL IMPRESSION(S) / ED DIAGNOSES  Final diagnoses:  Strain of right hamstring muscle, initial encounter      NEW MEDICATIONS STARTED DURING THIS VISIT:  ED Discharge Orders          Ordered    predniSONE (DELTASONE) 50 MG tablet  Daily with breakfast        03/14/21 2328    HYDROcodone-acetaminophen (NORCO/VICODIN) 5-325 MG tablet  Every 4 hours PRN         03/14/21 2328                This chart was dictated using voice recognition software/Dragon. Despite best efforts to proofread, errors can occur which can change the meaning. Any change was purely unintentional.    Darletta Moll, PA-C 03/14/21 [place],  Camillia Herter, MD 03/14/21 2337

## 2021-03-15 NOTE — ED Notes (Signed)
Patient verbalizes understanding of discharge instructions. Opportunity for questioning and answers were provided. Armband removed by staff, pt discharged from ED. Wheeled out to lobby  

## 2021-06-14 ENCOUNTER — Encounter: Payer: Medicare Other | Admitting: Dermatology

## 2021-06-18 ENCOUNTER — Other Ambulatory Visit: Payer: Self-pay

## 2021-06-18 DIAGNOSIS — Z86010 Personal history of colonic polyps: Secondary | ICD-10-CM

## 2021-06-18 NOTE — Progress Notes (Signed)
?Gastroenterology Pre-Procedure Review ? ?Request Date: 07/23/2021 ?Requesting Physician: Dr. Marius Ditch ? ? ?PATIENT REVIEW QUESTIONS: The patient responded to the following health history questions as indicated:   ? ?1. Are you having any GI issues? no ?2. Do you have a personal history of Polyps? yes (LAST COLONOSCOPY ) ?3. Do you have a family history of Colon Cancer or Polyps? yes (COLON CANCER) ?4. Diabetes Mellitus? no ?5. Joint replacements in the past 12 months?no ?6. Major health problems in the past 3 months?yes (LIGHT HEADED AND DIZZENES FROM AFIB) ?7. Any artificial heart valves, MVP, or defibrillator?no ?   ?MEDICATIONS & ALLERGIES:    ?Patient reports the following regarding taking any anticoagulation/antiplatelet therapy:   ?Plavix, Coumadin, Eliquis, Xarelto, Lovenox, Pradaxa, Brilinta, or Effient? yes Arne [place]) ?Aspirin? no ? ?Patient confirms/reports the following medications:  ?Current Outpatient Medications  ?Medication Sig Dispense Refill  ? albuterol (PROVENTIL HFA;VENTOLIN HFA) 108 (90 Base) MCG/ACT inhaler Inhale 2 puffs into the lungs every 6 (six) hours as needed for wheezing or shortness of breath.    ? Calcium-Vitamin D-Vitamin K 500-100-40 MG-UNT-MCG CHEW Chew 1 Dose by mouth daily.    ? conjugated estrogens (PREMARIN) vaginal cream Place 7.82 Applicatorfuls vaginally 2 (two) times a week. 42.5 g 12  ? conjugated estrogens (PREMARIN) vaginal cream Place vaginally.    ? diltiazem (CARDIZEM CD) 180 MG 24 hr capsule Take 180 mg by mouth daily.    ? ELIQUIS 5 MG TABS tablet Take 5 mg by mouth 2 (two) times daily.    ? fluticasone (FLONASE) 50 MCG/ACT nasal spray Place 2 sprays into the nose at bedtime.     ? HYDROcodone-acetaminophen (NORCO/VICODIN) 5-325 MG tablet Take 1 tablet by mouth every 4 (four) hours as needed for severe pain. 10 tablet 0  ? levofloxacin (LEVAQUIN) 500 MG tablet Take 500 mg by mouth daily.    ? meclizine (ANTIVERT) 25 MG tablet Take 25 mg by mouth 3 (three) times daily  as needed for dizziness.    ? metoprolol succinate (TOPROL-XL) 100 MG 24 hr tablet Take 1 tablet (100 mg total) by mouth daily. 30 tablet 0  ? Multiple Vitamins-Minerals (MULTIVITAMIN GUMMIES ADULT PO) Take 1 Dose by mouth daily.    ? naloxone (NARCAN) nasal spray 4 mg/0.1 mL SMARTSIG:Both Nares    ? oxybutynin (DITROPAN) 5 MG tablet TAKE 1 TABLET BY MOUTH AT  BEDTIME 90 tablet 1  ? oxyCODONE (OXY IR/ROXICODONE) 5 MG immediate release tablet     ? oxyCODONE (OXY IR/ROXICODONE) 5 MG immediate release tablet Take by mouth.    ? pantoprazole (PROTONIX) 40 MG tablet Take 40 mg by mouth 2 (two) times daily.     ? polyethylene glycol powder (GLYCOLAX/MIRALAX) powder Take by mouth as needed.     ? predniSONE (DELTASONE) 50 MG tablet Take 1 tablet (50 mg total) by mouth daily with breakfast. 5 tablet 0  ? rizatriptan (MAXALT) 10 MG tablet Take 10 mg by mouth as needed.     ? rosuvastatin (CRESTOR) 5 MG tablet Take 5 mg by mouth daily.    ? ?No current facility-administered medications for this visit.  ? ? ?Patient confirms/reports the following allergies:  ?Allergies  ?Allergen Reactions  ? Penicillins Rash  ? Ivp Dye [Iodinated Contrast Media] Swelling  ? Pravastatin Other (See Comments)  ?  Body aches  ? Alendronate Nausea Only  ? Codeine Nausea Only  ? Oseltamivir Rash  ? Rofecoxib Swelling  ?  Swelling of feet  ? ? ?No  orders of the defined types were placed in this encounter. ? ? ?AUTHORIZATION INFORMATION ?Primary Insurance: ?1D#: ?Group #: ? ?Secondary Insurance: ?1D#: ?Group #: ? ?SCHEDULE INFORMATION: ?Date: 07/23/2021 ?Time: ?[place] ? ?

## 2021-06-20 ENCOUNTER — Telehealth: Payer: Self-pay

## 2021-06-20 MED ORDER — GOLYTELY 236 G PO SOLR: 4000.0000 mL | Freq: Once | ORAL | 0 refills | Status: AC

## 2021-06-20 NOTE — Telephone Encounter (Signed)
Called patient we received her blood thinner clearance back dr Nehemiah Massed cleared her to stop eliquis 3 days before procedure and and restart 1 day after also sent in golytly per dr Marius Ditch and sent patient her communications  ?

## 2021-07-05 NOTE — Progress Notes (Signed)
RESENT CLEARANCE AND BLOOD THINNER ?

## 2021-07-09 ENCOUNTER — Telehealth: Payer: Self-pay

## 2021-07-09 ENCOUNTER — Other Ambulatory Visit: Payer: Self-pay

## 2021-07-09 MED ORDER — PEG 3350-KCL-NA BICARB-NACL 420 G PO SOLR: 4000.0000 mL | Freq: Once | ORAL | 0 refills | Status: AC

## 2021-07-09 NOTE — Telephone Encounter (Signed)
Danielle Richardson has been advised per  Dr. Alveria Apley blood thinner request received on 07/05/21 to "stop Eliquis 3 days prior or procedure" and "restart blood thinner 1 day after". ?Danielle Richardson verbalized understanding of these instructions. ?She had not received her rx from her pharmacy yet.  A new rx was sent to [facility] in [place] for Danielle Richardson. ? ?Thanks, ?Danielle Richardson ?

## 2021-07-18 ENCOUNTER — Ambulatory Visit (INDEPENDENT_AMBULATORY_CARE_PROVIDER_SITE_OTHER): Payer: Medicare Other | Admitting: Dermatology

## 2021-07-18 DIAGNOSIS — L820 Inflamed seborrheic keratosis: Secondary | ICD-10-CM

## 2021-07-18 DIAGNOSIS — M67441 Ganglion, right hand: Secondary | ICD-10-CM | POA: Diagnosis not present

## 2021-07-18 DIAGNOSIS — D1800 Hemangioma unspecified site: Secondary | ICD-10-CM

## 2021-07-18 DIAGNOSIS — L409 Psoriasis, unspecified: Secondary | ICD-10-CM | POA: Diagnosis not present

## 2021-07-18 DIAGNOSIS — Z1283 Encounter for screening for malignant neoplasm of skin: Secondary | ICD-10-CM | POA: Diagnosis not present

## 2021-07-18 DIAGNOSIS — D229 Melanocytic nevi, unspecified: Secondary | ICD-10-CM

## 2021-07-18 DIAGNOSIS — L719 Rosacea, unspecified: Secondary | ICD-10-CM | POA: Diagnosis not present

## 2021-07-18 DIAGNOSIS — L918 Other hypertrophic disorders of the skin: Secondary | ICD-10-CM | POA: Diagnosis not present

## 2021-07-18 DIAGNOSIS — L603 Nail dystrophy: Secondary | ICD-10-CM

## 2021-07-18 DIAGNOSIS — L578 Other skin changes due to chronic exposure to nonionizing radiation: Secondary | ICD-10-CM

## 2021-07-18 DIAGNOSIS — L814 Other melanin hyperpigmentation: Secondary | ICD-10-CM

## 2021-07-18 DIAGNOSIS — L821 Other seborrheic keratosis: Secondary | ICD-10-CM

## 2021-07-18 DIAGNOSIS — M67449 Ganglion, unspecified hand: Secondary | ICD-10-CM

## 2021-07-18 MED ORDER — MOMETASONE FUROATE 0.1 % EX SOLN: Freq: Every day | CUTANEOUS | 3 refills | Status: DC

## 2021-07-18 NOTE — Patient Instructions (Signed)
?Start Mometasone lotion daily 5 days per week to affected areas of scalp and behind ears ? ?Recommend Cerave cream daily behind ears ? ? ?Cryotherapy Aftercare ? ?Wash gently with soap and water everyday.   ?Apply Vaseline and Band-Aid daily until healed.  ? ? ?If You Need Anything After Your Visit ? ?If you have any questions or concerns for your doctor, please call our main line at [phone] and press option 4 to reach your doctor's medical assistant. If no one answers, please leave a voicemail as directed and we will return your call as soon as possible. Messages left after 4 pm will be answered the following business day.  ? ?You may also send [place] a message via MyChart. We typically respond to MyChart messages within 1-2 business days. ? ?For prescription refills, please ask your pharmacy to contact our office. Our fax number is [phone]. ? ?If you have an urgent issue when the clinic is closed that cannot wait until the next business day, you can page your doctor at the number below.   ? ?Please note that while we do our best to be available for urgent issues outside of office hours, we are not available 24/7.  ? ?If you have an urgent issue and are unable to reach [place], you may choose to seek medical care at your doctor's office, retail clinic, urgent care center, or emergency room. ? ?If you have a medical emergency, please immediately call 911 or go to the emergency department. ? ?Pager Numbers ? ?- Dr. Nehemiah Massed: [phone] ? ?- Dr. Laurence Ferrari: [phone] ? ?- Dr. Nicole Kindred: [phone] ? ?In the event of inclement weather, please call our main line at [phone] for an update on the status of any delays or closures. ? ?Dermatology Medication Tips: ?Please keep the boxes that topical medications come in in order to help keep track of the instructions about where and how to use these. Pharmacies typically print the medication instructions only on the boxes and not directly on the medication tubes.  ? ?If  your medication is too expensive, please contact our office at [phone] option 4 or send [place] a message through [facility].  ? ?We are unable to tell what your co-pay for medications will be in advance as this is different depending on your insurance coverage. However, we may be able to find a substitute medication at lower cost or fill out paperwork to get insurance to cover a needed medication.  ? ?If a prior authorization is required to get your medication covered by your insurance company, please allow [place] 1-2 business days to complete this process. ? ?Drug prices often vary depending on where the prescription is filled and some pharmacies may offer cheaper prices. ? ?The website www.goodrx.com contains coupons for medications through different pharmacies. The prices here do not account for what the cost may be with help from insurance (it may be cheaper with your insurance), but the website can give you the price if you did not use any insurance.  ?- You can print the associated coupon and take it with your prescription to the pharmacy.  ?- You may also stop by our office during regular business hours and pick up a GoodRx coupon card.  ?- If you need your prescription sent electronically to a different pharmacy, notify our office through [facility] or by phone at [phone] option 4. ? ? ? ? ?Si Usted Necesita Algo Despu?s de Su Visita ? ?Tambi?n puede enviarnos un mensaje a trav?s de  MyChart. Por lo general respondemos a los mensajes de MyChart en el transcurso de 1 a 2 d?as h?biles. ? ?Para renovar recetas, por favor pida a su farmacia que se ponga en contacto con nuestra oficina. Nuestro n?mero de fax es el [phone]. ? ?Si tiene un asunto urgente cuando la cl?nica est? cerrada y que no puede esperar hasta el siguiente d?a h?bil, puede llamar/localizar a su doctor(a) al n?mero que aparece a continuaci?n.  ? ?Por favor, tenga en cuenta que aunque hacemos todo lo posible para estar disponibles para  asuntos urgentes fuera del horario de oficina, no estamos disponibles las 24 horas del d?a, los 7 d?as de la semana.  ? ?Si tiene un problema urgente y no puede comunicarse con nosotros, puede optar por buscar atenci?n m?dica  en el consultorio de su doctor(a), en una cl?nica privada, en un centro de atenci?n urgente o en una sala de emergencias. ? ?Si tiene Engineer, maintenance (IT) m?dica, por favor llame inmediatamente al 911 o vaya a la sala de emergencias. ? ?N?meros de b?per ? ?- Dr. Nehemiah Massed: [phone] ? ?- Dra. Moye: [phone] ? ?- Dra. Nicole Kindred: [phone] ? ?En caso de inclemencias del tiempo, por favor llame a nuestra l?nea principal al [phone] para una actualizaci?n sobre el estado de cualquier retraso o cierre. ? ?Consejos para la medicaci?n en dermatolog?a: ?Por favor, guarde las cajas en las que vienen los medicamentos de uso t?pico para ayudarle a seguir las instrucciones sobre d?nde y c?mo usarlos. Las farmacias generalmente imprimen las instrucciones del medicamento s?lo en las cajas y no directamente en los tubos del [place].  ? ?Si su medicamento es muy caro, por favor, p?ngase en contacto con Zigmund Daniel llamando al [phone] y presione la opci?n 4 o env?enos un mensaje a trav?s de MyChart.  ? ?No podemos decirle cu?l ser? su copago por los medicamentos por adelantado ya que esto es diferente dependiendo de la cobertura de su seguro. Sin embargo, es posible que podamos encontrar un medicamento sustituto a Electrical engineer un formulario para que el seguro cubra el medicamento que se considera necesario.  ? ?Si se requiere Ardelia Mems autorizaci?n previa para que su compa??a de seguros [place] su medicamento, por favor perm?tanos de 1 a 2 d?as h?biles para completar este proceso. ? ?Los precios de los medicamentos var?an con frecuencia dependiendo del Environmental consultant de d?nde se surte la receta y alguna farmacias pueden ofrecer precios m?s baratos. ? ?El sitio web www.goodrx.com tiene cupones para  medicamentos de Airline pilot. Los precios aqu? no tienen en cuenta lo que podr?a costar con la ayuda del seguro (puede ser m?s barato con su seguro), pero el sitio web puede darle el precio si no utiliz? ning?n seguro.  ?- Puede imprimir el cup?n correspondiente y llevarlo con su receta a la farmacia.  ?- Tambi?n puede pasar por nuestra oficina durante el horario de atenci?n regular y recoger una tarjeta de cupones de GoodRx.  ?- Si necesita que su receta se env?e electr?nicamente a Chiropodist, informe a nuestra oficina a trav?s de MyChart de [place] o por tel?fono llamando al [phone] y presione la opci?n 4.  ?

## 2021-07-18 NOTE — Progress Notes (Signed)
? ?Follow-Up Visit ?  ?Subjective  ?Danielle Richardson is a 76 y.o. female who presents for the following: Annual Exam (No history of skin cancer or abnormal moles - TBSE today). ?The patient presents for Total-Body Skin Exam (TBSE) for skin cancer screening and mole check.  The patient has spots, moles and lesions to be evaluated, some may be new or changing and the patient has concerns that these could be cancer. ? ?The following portions of the chart were reviewed this encounter and updated as appropriate:  ? Tobacco  Allergies  Meds  Problems  Med Hx  Surg Hx  Fam Hx   ?  ?Review of Systems:  No other skin or systemic complaints except as noted in HPI or Assessment and Plan. ? ?Objective  ?Well appearing patient in no apparent distress; mood and affect are within normal limits. ? ?A full examination was performed including scalp, head, eyes, ears, nose, lips, neck, chest, axillae, abdomen, back, buttocks, bilateral upper extremities, bilateral lower extremities, hands, feet, fingers, toes, fingernails, and toenails. All findings within normal limits unless otherwise noted below. ? ?Face ?Pinkness ? ?Scalp, postauricular areas ?Pinkness and scale ? ?Right Upper Back (2) ?Erythematous stuck-on, waxy papule or plaque ? ?Left Axilla ?Fleshy, skin-colored pedunculated papules.   ? ?Right index finger ?Solitary, smooth skin colored to translucent papule.  ? ? ?Assessment & Plan  ? ?Lentigines ?- Scattered tan macules ?- Due to sun exposure ?- Benign-appearing, observe ?- Recommend daily broad spectrum sunscreen SPF 30+ to sun-exposed areas, reapply every 2 hours as needed. ?- Call for any changes ? ?Seborrheic Keratoses ?- Stuck-on, waxy, tan-brown papules and/or plaques  ?- Benign-appearing ?- Discussed benign etiology and prognosis. ?- Observe ?- Call for any changes ? ?Melanocytic Nevi ?- Tan-brown and/or pink-flesh-colored symmetric macules and papules ?- Benign appearing on exam today ?- Observation ?- Call  clinic for new or changing moles ?- Recommend daily use of broad spectrum spf 30+ sunscreen to sun-exposed areas.  ? ?Hemangiomas ?- Red papules ?- Discussed benign nature ?- Observe ?- Call for any changes ? ?Actinic Damage ?- Chronic condition, secondary to cumulative UV/sun exposure ?- diffuse scaly erythematous macules with underlying dyspigmentation ?- Recommend daily broad spectrum sunscreen SPF 30+ to sun-exposed areas, reapply every 2 hours as needed.  ?- Staying in the shade or wearing long sleeves, sun glasses (UVA+UVB protection) and wide brim hats (4-inch brim around the entire circumference of the hat) are also recommended for sun protection.  ?- Call for new or changing lesions. ? ?Skin cancer screening performed today. ? ?Rosacea ?Face ?Rosacea is a chronic progressive skin condition usually affecting the face of adults, causing redness and/or acne bumps. It is treatable but not curable. It sometimes affects the eyes (ocular rosacea) as well. It may respond to topical and/or systemic medication and can flare with stress, sun exposure, alcohol, exercise and some foods.  Daily application of broad spectrum spf 30+ sunscreen to face is recommended to reduce flares. ?Discussed the treatment option of BBL/laser.  Typically we recommend 1-3 treatment sessions about 5-8 weeks apart for best results.  The patient's condition may require "maintenance treatments" in the future.  The fee for BBL / laser treatments is $350 per treatment session for the whole face.  A fee can be quoted for other parts of the body. ?Insurance typically does not pay for BBL/laser treatments and therefore the fee is an out-of-pocket cost. ?Patient declines treatment at that time. ? ?Psoriasis ?Scalp, postauricular areas ?Psoriasis  is a chronic non-curable, but treatable genetic/hereditary disease that may have other systemic features affecting other organ systems such as joints (Psoriatic Arthritis). It is associated with an  increased risk of inflammatory bowel disease, heart disease, non-alcoholic fatty liver disease, and depression.   ? ?Start Mometasone lotion daily up to 5 days per week to affected areas of scalp and behind ears ?Recommend Cerave cream daily behind ears ?Chronic and persistent condition with duration or expected duration over one year. Condition is symptomatic / bothersome to patient. Not to goal. ?mometasone (ELOCON) 0.1 % lotion - Scalp, postauricular areas ?Apply topically daily. 5 times per week to affected areas of scalp and behind ears ? ?Inflamed seborrheic keratosis (2) ?Right Upper Back ?Destruction of lesion - Right Upper Back ?Complexity: simple   ?Destruction method: cryotherapy   ?Informed consent: discussed and consent obtained   ?Timeout:  patient name, date of birth, surgical site, and procedure verified ?Lesion destroyed using liquid nitrogen: Yes   ?Region frozen until ice ball extended beyond lesion: Yes   ?Outcome: patient tolerated procedure well with no complications   ?Post-procedure details: wound care instructions given   ? ?Skin tag ?Left Axilla ?Irritating.  Patient would like treated. ?Destruction of lesion - Left Axilla ?Complexity: simple   ?Destruction method: cryotherapy   ?Informed consent: discussed and consent obtained   ?Timeout:  patient name, date of birth, surgical site, and procedure verified ?Lesion destroyed using liquid nitrogen: Yes   ?Region frozen until ice ball extended beyond lesion: Yes   ?Outcome: patient tolerated procedure well with no complications   ?Post-procedure details: wound care instructions given   ? ?Digital mucous cyst ?Right index finger ?With nail dystrophy ?A digital mucous cyst also known as a myxoid cyst or pseudocyst is a ganglion cyst arising from the distal interphalangeal (DIP) joint of the finger or thumb (or, less commonly, toe). The cysts are believed to form from degeneration of connective tissue and are associated with osteoarthritic joints  or injury. Although the exact etiology is unknown, it is likely that a small tear forms in a joint capsule or tendon sheath, allowing extravasation of synovial fluid into the adjacent tissue. When the fluid reacts with local tissue, it becomes more gelatinous and a cyst wall forms. ?With any treatment, there is a high rate of recurrence.   ?Treatment options include: ?- Puncture / Incision & Drainage (I&D) ?- Intralesional steroid injection ?- Intralesional Sclerosant injection (Asclera/ Polidocanol) ?- Intralesional steroid + sclerosant ?- Corticosteroid tape ?- Cryosurgery ?- Laser (CO2) ?- Infrared photocoagulation ?- Excision / Surgery  ? ?No treatment recommend at this time since it is not symptomatic. Discussed high risk of recurrence with treatment. ? ?Skin cancer screening ? ?Return in about 1 year (around 07/19/2022) for TBSE. ? ?I, Ashok Cordia, CMA, am acting as scribe for Sarina Ser, MD . ?Documentation: I have reviewed the above documentation for accuracy and completeness, and I agree with the above. ? ?Sarina Ser, MD ? ?

## 2021-07-23 ENCOUNTER — Encounter
Admission: RE | Disposition: A | Discharge status: Home / Self Care | Payer: Self-pay | Source: Ambulatory Visit | Attending: Gastroenterology

## 2021-07-23 ENCOUNTER — Ambulatory Visit
Admission: RE | Admit: 2021-07-23 | Discharge: 2021-07-23 | Disposition: A | Discharge status: Home / Self Care | Payer: Medicare Other | Source: Ambulatory Visit | Attending: Gastroenterology | Admitting: Gastroenterology

## 2021-07-23 ENCOUNTER — Ambulatory Visit: Payer: Medicare Other | Admitting: Anesthesiology

## 2021-07-23 DIAGNOSIS — D123 Benign neoplasm of transverse colon: Secondary | ICD-10-CM | POA: Insufficient documentation

## 2021-07-23 DIAGNOSIS — K5730 Diverticulosis of large intestine without perforation or abscess without bleeding: Secondary | ICD-10-CM | POA: Diagnosis not present

## 2021-07-23 DIAGNOSIS — K635 Polyp of colon: Secondary | ICD-10-CM | POA: Diagnosis not present

## 2021-07-23 DIAGNOSIS — D120 Benign neoplasm of cecum: Secondary | ICD-10-CM | POA: Insufficient documentation

## 2021-07-23 DIAGNOSIS — Z86010 Personal history of colonic polyps: Secondary | ICD-10-CM

## 2021-07-23 DIAGNOSIS — Z87891 Personal history of nicotine dependence: Secondary | ICD-10-CM | POA: Insufficient documentation

## 2021-07-23 DIAGNOSIS — Z1211 Encounter for screening for malignant neoplasm of colon: Secondary | ICD-10-CM | POA: Diagnosis not present

## 2021-07-23 DIAGNOSIS — D125 Benign neoplasm of sigmoid colon: Secondary | ICD-10-CM | POA: Diagnosis not present

## 2021-07-23 HISTORY — PX: COLONOSCOPY WITH PROPOFOL: SHX5780

## 2021-07-23 SURGERY — COLONOSCOPY WITH PROPOFOL
Anesthesia: General

## 2021-07-23 MED ORDER — PROPOFOL 500 MG/50ML IV EMUL
INTRAVENOUS | Status: DC | PRN
  Administered 2021-07-23: 150 ug/kg/min via INTRAVENOUS

## 2021-07-23 MED ORDER — PROPOFOL 500 MG/50ML IV EMUL
INTRAVENOUS | Status: AC
  Filled 2021-07-23: qty 50

## 2021-07-23 MED ORDER — SODIUM CHLORIDE 0.9 % IV SOLN: INTRAVENOUS | Status: DC

## 2021-07-23 MED ORDER — LIDOCAINE HCL (CARDIAC) PF 100 MG/5ML IV SOSY
PREFILLED_SYRINGE | INTRAVENOUS | Status: DC | PRN
  Administered 2021-07-23: 50 mg via INTRAVENOUS

## 2021-07-23 MED ORDER — PROPOFOL 10 MG/ML IV BOLUS
INTRAVENOUS | Status: DC | PRN
  Administered 2021-07-23: 50 mg via INTRAVENOUS
  Administered 2021-07-23: 30 mg via INTRAVENOUS
  Administered 2021-07-23: 20 mg via INTRAVENOUS

## 2021-07-23 NOTE — Transfer of Care (Signed)
Immediate Anesthesia Transfer of Care Note ? ?Patient: Danielle Richardson ? ?Procedure(s) Performed: COLONOSCOPY WITH PROPOFOL ? ?Patient Location: PACU and Endoscopy Unit ? ?Anesthesia Type:General ? ?Level of Consciousness: patient cooperative ? ?Airway & Oxygen Therapy: Patient Spontanous Breathing ? ?Post-op Assessment: Report given to RN and Post -op Vital signs reviewed and stable ? ?Post vital signs: Reviewed and stable ? ?Last Vitals:  ?Vitals Value Taken Time  ?BP 131/70 07/23/21 0943  ?Temp 36.5 ?C 07/23/21 0943  ?Pulse 66 07/23/21 0943  ?Resp 20 07/23/21 0943  ?SpO2 96 % 07/23/21 0943  ?Vitals shown include unvalidated device data. ? ?Last Pain:  ?Vitals:  ? 07/23/21 0943  ?TempSrc: Tympanic  ?PainSc: Asleep  ?   ? ?  ? ?Complications: No notable events documented. ?

## 2021-07-23 NOTE — H&P (Signed)
?Cephas Darby, MD ?[address]  ?[address]  ?[place], [place] [address]  ?Main: [phone]  ?Fax: [phone] ?Pager: [phone] ? ?Primary Care Physician:  [name], MD ?Primary Gastroenterologist:  Dr. Cephas Darby ? ?Pre-Procedure History & Physical: ?HPI:  Danielle Richardson is a 76 y.o. female is here for an colonoscopy. ?  ?Past Medical History:  ?Diagnosis Date  ? Arthritis   ? Cataract   ? Chronic obstructive pulmonary disease ([place]) 09/11/2013  ? Coronary artery disease involving native coronary artery of native heart 09/23/2016  ? Overview:  Minimal by cath 2017  ? GERD (gastroesophageal reflux disease)   ? HLD (hyperlipidemia) 09/11/2013  ? Joint pain   ? Left bundle branch block   ? A-fib, Left BBB  ? Migraine   ? Pelvic pain in female   ? SUI (stress urinary incontinence, female)   ? Vertigo   ? Vertigo   ? ? ?Past Surgical History:  ?Procedure Laterality Date  ? AUGMENTATION MAMMAPLASTY Bilateral   ? Pt had implants and then removed  ? bladder tack  1999  ? CARDIAC CATHETERIZATION N/A 09/12/2015  ? Procedure: Left Heart Cath and Coronary Angiography;  Surgeon: Corey Skains, MD;  Location: [facility];  Service: Cardiovascular;  Laterality: N/A;  ? CATARACT EXTRACTION W/ INTRAOCULAR LENS  IMPLANT, BILATERAL  2018  ? CHOLECYSTECTOMY  1994  ? COLONOSCOPY WITH PROPOFOL N/A 02/25/2017  ? Procedure: COLONOSCOPY WITH PROPOFOL;  Surgeon: Virgel Manifold, MD;  Location: [facility];  Service: Endoscopy;  Laterality: N/A;  ? COLONOSCOPY WITH PROPOFOL N/A 06/17/2018  ? Procedure: COLONOSCOPY WITH PROPOFOL;  Surgeon: Virgel Manifold, MD;  Location: [facility];  Service: Endoscopy;  Laterality: N/A;  ? ESOPHAGOGASTRODUODENOSCOPY (EGD) WITH PROPOFOL N/A 02/25/2017  ? Procedure: ESOPHAGOGASTRODUODENOSCOPY (EGD) WITH PROPOFOL;  Surgeon: Virgel Manifold, MD;  Location: [place];  Service: Endoscopy;  Laterality: N/A;  ? EYE SURGERY    ? HYSTEROSCOPY  WITH D & C N/A 01/08/2016  ? Procedure: DILATATION AND CURETTAGE /HYSTEROSCOPY;  Surgeon: Brayton Mars, MD;  Location: [facility];  Service: Gynecology;  Laterality: N/A;  ? JOINT REPLACEMENT  2010  ? right shoulder  ? NECK SURGERY  2010  ? Cervical Discectomy with fusion & plating  ? SHOULDER SURGERY Right 2010  ? Total Shoulder Replacement, [facility]  ? TOE SURGERY    ? ? ?Prior to Admission medications   ?Medication Sig Start Date End Date Taking? Authorizing Provider  ?diltiazem (CARDIZEM CD) 180 MG 24 hr capsule Take 180 mg by mouth daily. 09/28/20  Yes [provider]  ?pantoprazole (PROTONIX) 40 MG tablet Take 40 mg by mouth 2 (two) times daily.  03/28/15  Yes [provider]  ?rosuvastatin (CRESTOR) 5 MG tablet Take 5 mg by mouth daily.   Yes [provider]  ?albuterol (PROVENTIL HFA;VENTOLIN HFA) 108 (90 Base) MCG/ACT inhaler Inhale 2 puffs into the lungs every 6 (six) hours as needed for wheezing or shortness of breath.    [provider]  ?Calcium-Vitamin D-Vitamin K 500-100-40 MG-UNT-MCG CHEW Chew 1 Dose by mouth daily.    [provider]  ?conjugated estrogens (PREMARIN) vaginal cream Place 8.84 Applicatorfuls vaginally 2 (two) times a week. 01/20/17   Defrancesco, [name], MD  ?conjugated estrogens (PREMARIN) vaginal cream Place vaginally. 06/18/21   [provider]  ?ELIQUIS 5 MG TABS tablet Take 5 mg by mouth 2 (two) times daily. 10/30/20   [provider]  ?fluticasone (FLONASE) 50 MCG/ACT nasal spray Place 2 sprays into the nose at bedtime.  03/28/15   [provider]  ?HYDROcodone-acetaminophen (NORCO/VICODIN) 5-325 MG tablet Take 1 tablet by mouth every 4 (four) hours as needed for severe pain. 03/14/21 03/14/22  Cuthriell, Charline Bills, PA-C  ?levofloxacin (LEVAQUIN) 500 MG tablet Take 500 mg by mouth daily. ?Patient not taking: Reported on 07/23/2021 03/07/21   [provider]  ?meclizine (ANTIVERT) 25 MG  tablet Take 25 mg by mouth 3 (three) times daily as needed for dizziness.    [provider]  ?metoprolol succinate (TOPROL-XL) 100 MG 24 hr tablet Take 1 tablet (100 mg total) by mouth daily. ?Patient not taking: Reported on 07/23/2021 05/22/18   Max Sane, MD  ?mometasone (ELOCON) 0.1 % lotion Apply topically daily. 5 times per week to affected areas of scalp and behind ears 07/18/21   Ralene Bathe, MD  ?Multiple Vitamins-Minerals (MULTIVITAMIN GUMMIES ADULT PO) Take 1 Dose by mouth daily.    [provider]  ?naloxone Karma Greaser) nasal spray 4 mg/0.1 mL SMARTSIG:Both Nares 06/15/21   [provider]  ?oxybutynin (DITROPAN) 5 MG tablet TAKE 1 TABLET BY MOUTH AT  BEDTIME ?Patient not taking: Reported on 07/23/2021 08/23/20   Billey Co, MD  ?oxyCODONE (OXY IR/ROXICODONE) 5 MG immediate release tablet  10/27/17   [provider]  ?oxyCODONE (OXY IR/ROXICODONE) 5 MG immediate release tablet Take by mouth. 06/15/21   [provider]  ?polyethylene glycol powder (GLYCOLAX/MIRALAX) powder Take by mouth as needed.  09/14/13   [provider]  ?predniSONE (DELTASONE) 50 MG tablet Take 1 tablet (50 mg total) by mouth daily with breakfast. ?Patient not taking: Reported on 07/23/2021 03/14/21   Cuthriell, Charline Bills, PA-C  ?rizatriptan (MAXALT) 10 MG tablet Take 10 mg by mouth as needed.     [provider]  ? ? ?Allergies as of 06/19/2021 - Review Complete 06/18/2021  ?Allergen Reaction Noted  ? Penicillins Rash 04/25/2015  ? Ivp dye [iodinated contrast media] Swelling 01/21/2018  ? Pravastatin Other (See Comments) 10/08/2016  ? Alendronate Nausea Only 04/25/2015  ? Codeine Nausea Only 04/25/2015  ? Oseltamivir Rash 04/25/2015  ? Rofecoxib Swelling 04/25/2015  ? ? ?Family History  ?Problem Relation Age of Onset  ? Diabetes Father   ? Diabetes Brother   ? Colon cancer Maternal Uncle   ? Ovarian cancer Maternal Grandfather   ? Breast cancer Neg Hx   ? ? ?Social  History  ? ?Socioeconomic History  ? Marital status: Married  ?  Spouse name: Not on file  ? Number of children: Not on file  ? Years of education: Not on file  ? Highest education level: Not on file  ?Occupational History  ? Not on file  ?Tobacco Use  ? Smoking status: Former  ?  Types: Cigarettes  ?  Quit date: 04/02/1999  ?  Years since quitting: 22.3  ? Smokeless tobacco: Never  ?Vaping Use  ? Vaping Use: Never used  ?Substance and Sexual Activity  ? Alcohol use: Not Currently  ? Drug use: No  ? Sexual activity: Not Currently  ?  Birth control/protection: Post-menopausal  ?Other Topics Concern  ? Not on file  ?Social History Narrative  ? Not on file  ? ?Social Determinants of Health  ? ?Financial Resource Strain: Not on file  ?Food Insecurity: Not on file  ?Transportation Needs: Not on file  ?Physical Activity: Not on file  ?Stress: Not on  file  ?Social Connections: Not on file  ?Intimate Partner Violence: Not on file  ? ? ?Review of Systems: ?See HPI, otherwise negative ROS ? ?Physical Exam: ?BP (!) 160/84   Pulse 73   Temp 97.8 ?F (36.6 ?C) (Temporal)   Resp 18   Ht '5\' 5"'$  (1.651 m)   Wt 81.6 kg   SpO2 98%   BMI 29.95 kg/m?  ?General:   Alert,  pleasant and cooperative in NAD ?Head:  Normocephalic and atraumatic. ?Neck:  Supple; no masses or thyromegaly. ?Lungs:  Clear throughout to auscultation.    ?Heart:  Regular rate and rhythm. ?Abdomen:  Soft, nontender and nondistended. Normal bowel sounds, without guarding, and without rebound.   ?Neurologic:  Alert and  oriented x4;  grossly normal neurologically. ? ?Impression/Plan: ?Marykay S [name] is here for an colonoscopy to be performed for h/o colon adenomas ? ?Risks, benefits, limitations, and alternatives regarding  colonoscopy have been reviewed with the patient.  Questions have been answered.  All parties agreeable. ? ? ?Sherri Sear, MD  07/23/2021, 9:03 AM ?

## 2021-07-23 NOTE — Anesthesia Preprocedure Evaluation (Signed)
Anesthesia Evaluation  ?Patient identified by MRN, date of birth, ID band ?Patient awake ? ? ? ?Reviewed: ?Allergy & Precautions, NPO status , Patient's Chart, lab work & pertinent test results, reviewed documented beta blocker date and time  ? ?Airway ?Mallampati: III ? ?TM Distance: >3 FB ?Neck ROM: Full ? ? ? Dental ?no notable dental hx. ?(+) Chipped ?  ?Pulmonary ?COPD, former smoker,  ?  ?Pulmonary exam normal ?breath sounds clear to auscultation ? ? ? ? ? ? Cardiovascular ?hypertension, Pt. on medications and Pt. on home beta blockers ?+ angina + CAD  ?+ dysrhythmias Atrial Fibrillation  ?Rhythm:Irregular Rate:Normal ?- Systolic murmurs ? ?  ?Neuro/Psych ? Headaches,   ? GI/Hepatic ?GERD  ,  ?Endo/Other  ? ? Renal/GU ?  ? ?  ?Musculoskeletal ? ?(+) Arthritis ,  ? Abdominal ?  ?Peds ? Hematology ?  ?Anesthesia Other Findings ?EKG shows Lbbb. No cardiac stents. ? Reproductive/Obstetrics ? ?  ? ? ? ? ? ? ? ? ? ? ? ? ? ?  ?  ? ? ? ? ? ? ? ?Anesthesia Physical ? ?Anesthesia Plan ? ?ASA: 3 ? ?Anesthesia Plan: General  ? ?Post-op Pain Management: Minimal or no pain anticipated  ? ?Induction: Intravenous ? ?PONV Risk Score and Plan: 3 and Propofol infusion, TIVA and Ondansetron ? ?Airway Management Planned: Nasal Cannula ? ?Additional Equipment: None ? ?Intra-op Plan:  ? ?Post-operative Plan:  ? ?Informed Consent: I have reviewed the patients History and Physical, chart, labs and discussed the procedure including the risks, benefits and alternatives for the proposed anesthesia with the patient or authorized representative who has indicated his/her understanding and acceptance.  ? ? ? ?Dental advisory given ? ?Plan Discussed with: CRNA ? ?Anesthesia Plan Comments: (Discussed risks of anesthesia with patient, including possibility of difficulty with spontaneous ventilation under anesthesia necessitating airway intervention, PONV, and rare risks such as cardiac or respiratory or  neurological events, and allergic reactions. Discussed the role of CRNA in patient's perioperative care. Patient understands.)  ? ? ? ? ? ?Anesthesia Quick Evaluation ? ?

## 2021-07-23 NOTE — Anesthesia Procedure Notes (Signed)
Procedure Name: [place] ?Date/Time: 07/23/2021 9:17 AM ?Performed by: Jerrye Noble, CRNA ?Pre-anesthesia Checklist: Patient identified, Emergency Drugs available, Suction available and Patient being monitored ?Patient Re-evaluated:Patient Re-evaluated prior to induction ?Oxygen Delivery Method: Nasal cannula ? ? ? ? ?

## 2021-07-23 NOTE — Anesthesia Postprocedure Evaluation (Signed)
Anesthesia Post Note ? ?Patient: Danielle Richardson ? ?Procedure(s) Performed: COLONOSCOPY WITH PROPOFOL ? ?Patient location during evaluation: Endoscopy ?Anesthesia Type: General ?Level of consciousness: awake and alert ?Pain management: pain level controlled ?Vital Signs Assessment: post-procedure vital signs reviewed and stable ?Respiratory status: spontaneous breathing, nonlabored ventilation, respiratory function stable and patient connected to nasal cannula oxygen ?Cardiovascular status: blood pressure returned to baseline and stable ?Postop Assessment: no apparent nausea or vomiting ?Anesthetic complications: no ? ? ?No notable events documented. ? ? ?Last Vitals:  ?Vitals:  ? 07/23/21 0953 07/23/21 1003  ?BP: 114/83 128/72  ?Pulse: 71 68  ?Resp: 19 16  ?Temp:    ?SpO2: 97% 99%  ?  ?Last Pain:  ?Vitals:  ? 07/23/21 1003  ?TempSrc:   ?PainSc: 0-No pain  ? ? ?  ?  ?  ?  ?  ?  ? ?Arita Miss ? ? ? ? ?

## 2021-07-23 NOTE — Op Note (Addendum)
[facility] ?Gastroenterology ?Patient Name: Danielle Richardson ?Procedure Date: 07/23/2021 9:03 AM ?MRN: [id number] ?Account #: [id number] ?Date of Birth: 08/26/1945 ?Admit Type: Outpatient ?Age: 76 ?Room: [facility] ENDO ROOM 1 ?Gender: Female ?Note Status: Finalized ?Instrument Name: Colonscope [id number] ?Procedure:             Colonoscopy ?Indications:           Surveillance: Personal history of adenomatous polyps  ?                       on last colonoscopy 3 years ago, Last colonoscopy:  ?                       March 2020 ?Providers:             Lin Landsman MD, MD ?Medicines:             General Anesthesia ?Complications:         No immediate complications. Estimated blood loss: None. ?Procedure:             Pre-Anesthesia Assessment: ?                       - Prior to the procedure, a History and Physical was  ?                       performed, and patient medications and allergies were  ?                       reviewed. The patient is competent. The risks and  ?                       benefits of the procedure and the sedation options and  ?                       risks were discussed with the patient. All questions  ?                       were answered and informed consent was obtained.  ?                       Patient identification and proposed procedure were  ?                       verified by the physician, the nurse, the  ?                       anesthesiologist, the anesthetist and the technician  ?                       in the pre-procedure area in the procedure room in the  ?                       endoscopy suite. Mental Status Examination: alert and  ?                       oriented. Airway Examination: normal oropharyngeal  ?                       airway and neck mobility.  Respiratory Examination:  ?                       clear to auscultation. CV Examination: normal.  ?                       Prophylactic Antibiotics: The patient does not require  ?                       prophylactic  antibiotics. Prior Anticoagulants: The  ?                       patient has taken Eliquis (apixaban), last dose was 3  ?                       days prior to procedure. ASA Grade Assessment: III - A  ?                       patient with severe systemic disease. After reviewing  ?                       the risks and benefits, the patient was deemed in  ?                       satisfactory condition to undergo the procedure. The  ?                       anesthesia plan was to use general anesthesia.  ?                       Immediately prior to administration of medications,  ?                       the patient was re-assessed for adequacy to receive  ?                       sedatives. The heart rate, respiratory rate, oxygen  ?                       saturations, blood pressure, adequacy of pulmonary  ?                       ventilation, and response to care were monitored  ?                       throughout the procedure. The physical status of the  ?                       patient was re-assessed after the procedure. ?                       After obtaining informed consent, the colonoscope was  ?                       passed under direct vision. Throughout the procedure,  ?                       the patient's blood pressure, pulse, and oxygen  ?  saturations were monitored continuously. The  ?                       Colonoscope was introduced through the anus and  ?                       advanced to the the cecum, identified by appendiceal  ?                       orifice and ileocecal valve. The colonoscopy was  ?                       performed without difficulty. The patient tolerated  ?                       the procedure well. The quality of the bowel  ?                       preparation was evaluated using the BBPS [facility] Bowel  ?                       Preparation Scale) with scores of: Right Colon = 3,  ?                       Transverse Colon = 3 and Left Colon = 3 (entire mucosa  ?                        seen well with no residual staining, small fragments  ?                       of stool or opaque liquid). The total BBPS score  ?                       equals 9. ?Findings: ?     The perianal and digital rectal examinations were normal. Pertinent  ?     negatives include normal sphincter tone and no palpable rectal lesions. ?     Two sessile polyps were found in the transverse colon and ileocecal  ?     valve. The polyps were small in size. These polyps were removed with a  ?     cold snare. Resection and retrieval were complete. Estimated blood loss:  ?     none. ?     A diminutive polyp was found in the sigmoid colon. The polyp was  ?     sessile. The polyp was removed with a cold biopsy forceps. Resection and  ?     retrieval were complete. Estimated blood loss: none. ?     Multiple diverticula were found in the recto-sigmoid colon and sigmoid  ?     colon. ?     The retroflexed view of the distal rectum and anal verge was normal and  ?     showed no anal or rectal abnormalities. ?Impression:            - Two small polyps in the transverse colon and at the  ?                       ileocecal valve, removed with a cold snare. Resected  ?  and retrieved. ?                       - One diminutive polyp in the sigmoid colon, removed  ?                       with a cold biopsy forceps. Resected and retrieved. ?                       - Diverticulosis in the recto-sigmoid colon and in the  ?                       sigmoid colon. ?                       - The distal rectum and anal verge are normal on  ?                       retroflexion view. ?Recommendation:        - Discharge patient to home (with escort). ?                       - Resume previous diet today. ?                       - Continue present medications. ?                       - Await pathology results. ?                       - Repeat colonoscopy in 5 years for surveillance of  ?                       multiple polyps. ?                        - Resume Eliquis (apixaban) at prior dose tomorrow.  ?                       Refer to managing physician for further adjustment of  ?                       therapy. ?Procedure Code(s):     --- Professional --- ?                       [phone], Colonoscopy, flexible; with removal of  ?                       tumor(s), polyp(s), or other lesion(s) by snare  ?                       technique ?                       [id number], 59, Colonoscopy, flexible; with biopsy, single  ?                       or multiple ?CPT copyright 2019 American Medical Association. All rights reserved. ?The codes documented in this report are preliminary and upon coder review may  ?  be revised to meet current compliance requirements. ?Dr. Ulyess Mort ?Delano Frate Raeanne Gathers MD, MD ?07/23/2021 9:41:19 AM ?This report has been signed electronically. ?Number of Addenda: 0 ?Note Initiated On: 07/23/2021 9:03 AM ?Scope Withdrawal Time: 0 hours 11 minutes 5 seconds  ?Total Procedure Duration: 0 hours 15 minutes 14 seconds  ?Estimated Blood Loss:  Estimated blood loss: none. ?     [facility] ?

## 2021-07-24 ENCOUNTER — Encounter: Payer: Self-pay | Admitting: Gastroenterology

## 2021-07-24 LAB — SURGICAL PATHOLOGY

## 2021-07-25 ENCOUNTER — Encounter: Payer: Self-pay | Admitting: Gastroenterology

## 2021-07-27 ENCOUNTER — Encounter: Payer: Self-pay | Admitting: Dermatology

## 2021-09-23 IMAGING — CT CT HEAD W/O CM
3 series · 15 of 46 positions shown, 18 images · non-contrast
Comparison: 02/01/2008

CLINICAL DATA: Fell in the shower with trauma to the head and neck.

EXAM:
CT HEAD WITHOUT CONTRAST
CT CERVICAL SPINE WITHOUT CONTRAST
TECHNIQUE: Multidetector CT imaging of the head and cervical spine was
performed following the standard protocol without intravenous
contrast. Multiplanar CT image reconstructions of the cervical spine
were also generated.

[Series 3: head wo · axial · 0.39mm/px · z∈[-129,-9]mm · 9 of 29 slices shown, 12 images]
[im 3/29  brain]
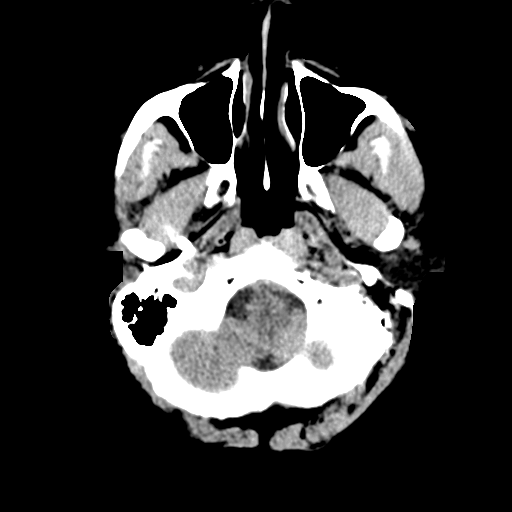
[im 3/29  bone]
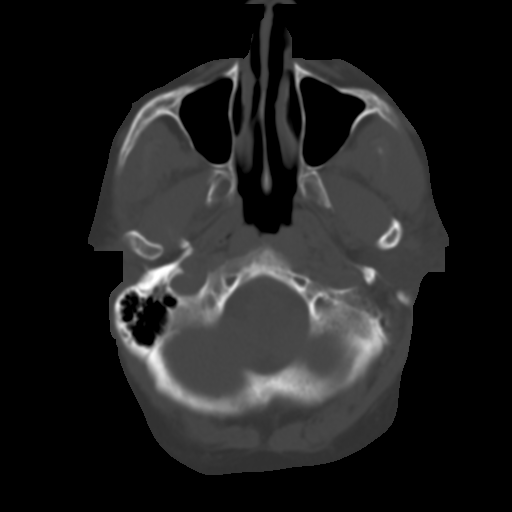
[im 6/29  brain]
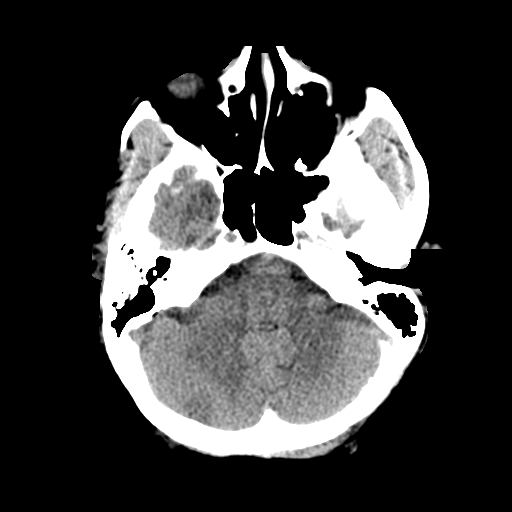
[im 9/29  brain]
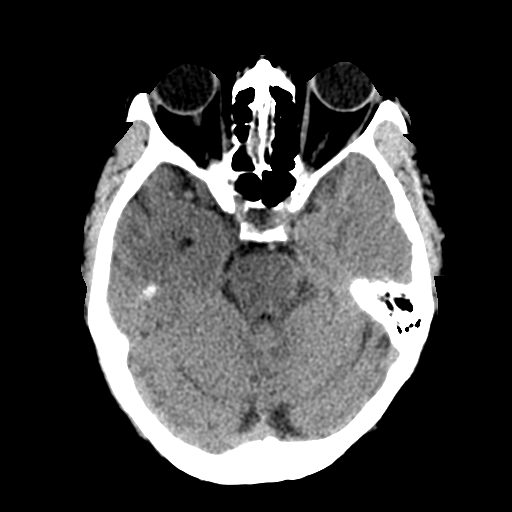
[im 12/29  brain]
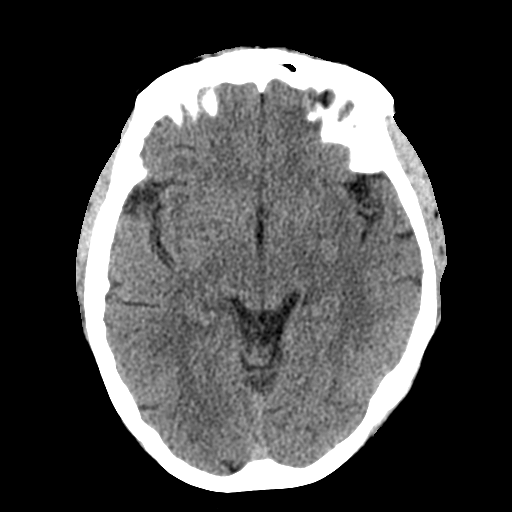
[im 15/29  brain]
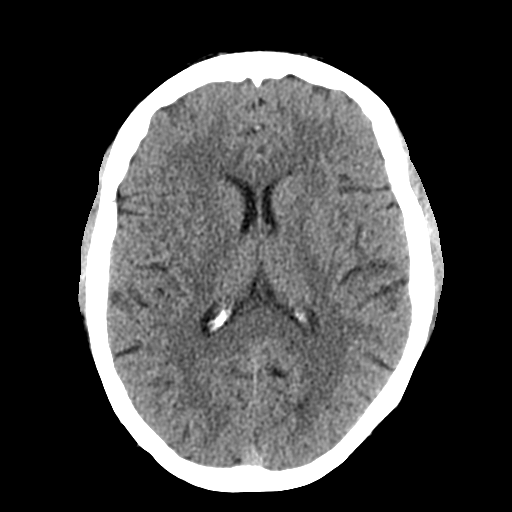
[im 15/29  bone]
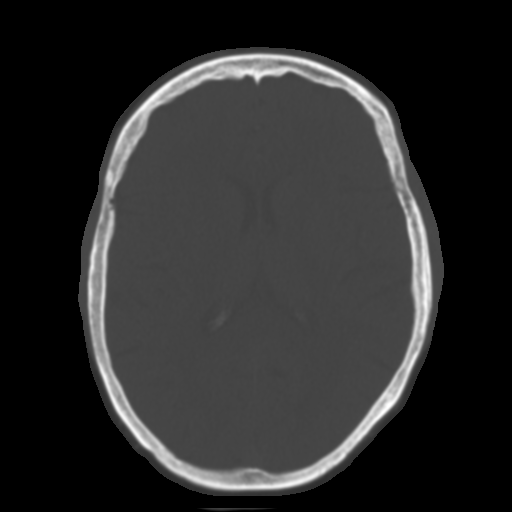
[im 18/29  brain]
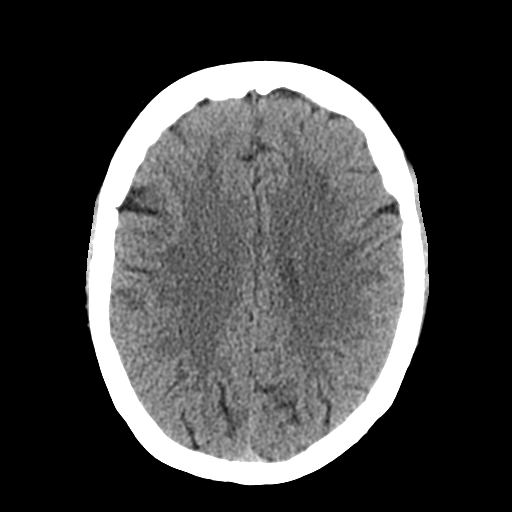
[im 21/29  brain]
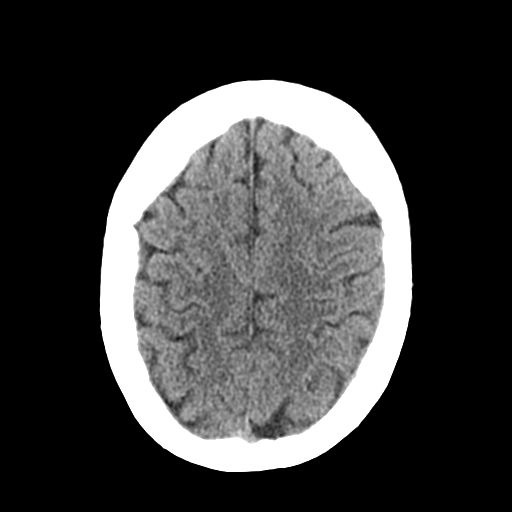
[im 24/29  brain]
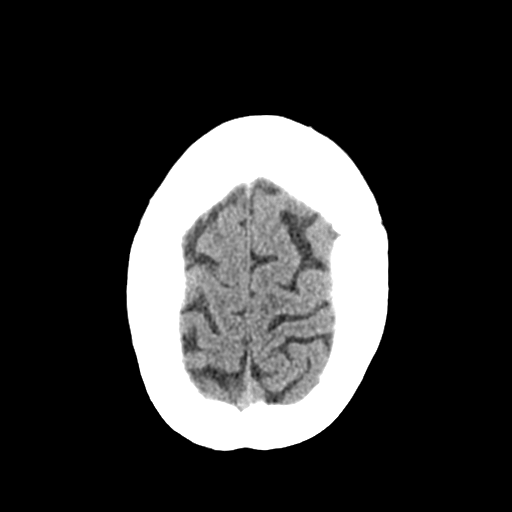
[im 27/29  brain]
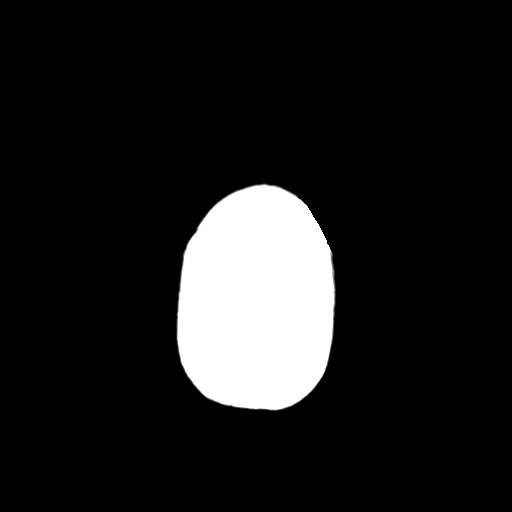
[im 27/29  bone]
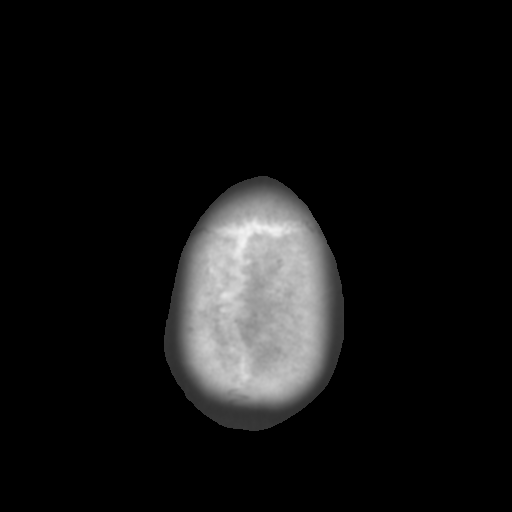

[Series 4: coronal soft tissue · coronal · 0.27mm/px · 3 of 62 slices shown]
[im 23/62  brain]
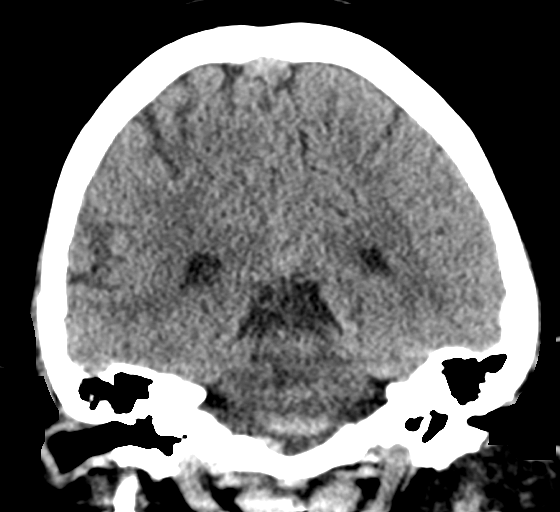
[im 28/62  brain]
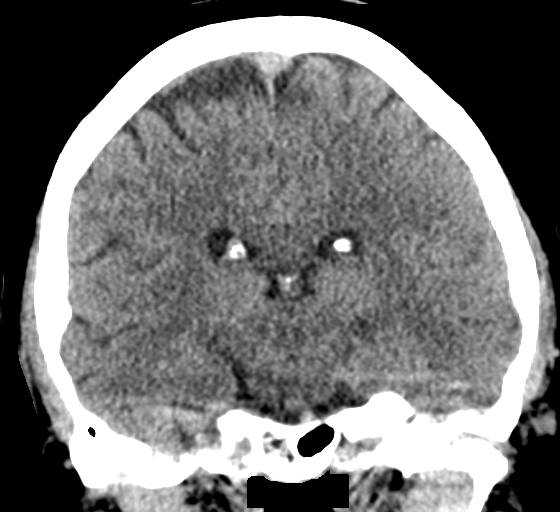
[im 34/62  brain]
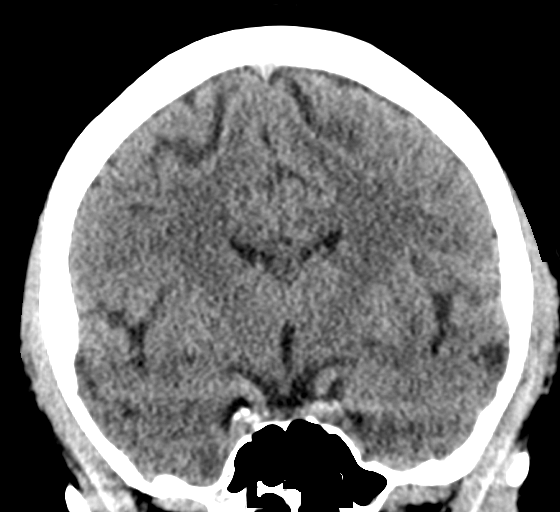

[Series 5: sagittal soft tissue · sagittal · 0.27mm/px · 3 of 52 slices shown]
[im 18/52  brain]
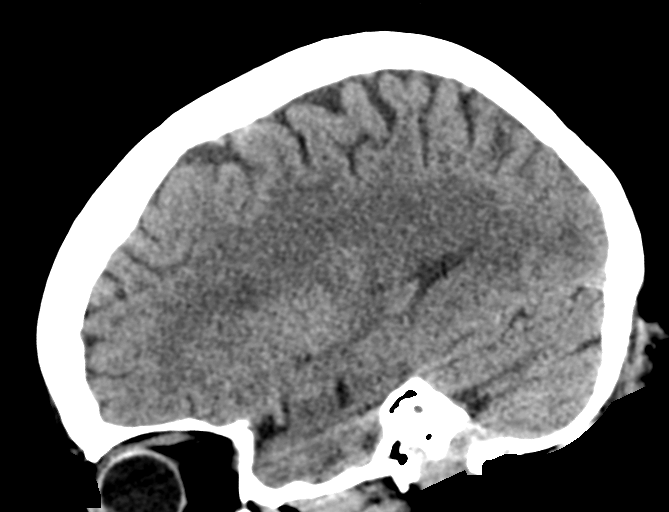
[im 26/52  brain]
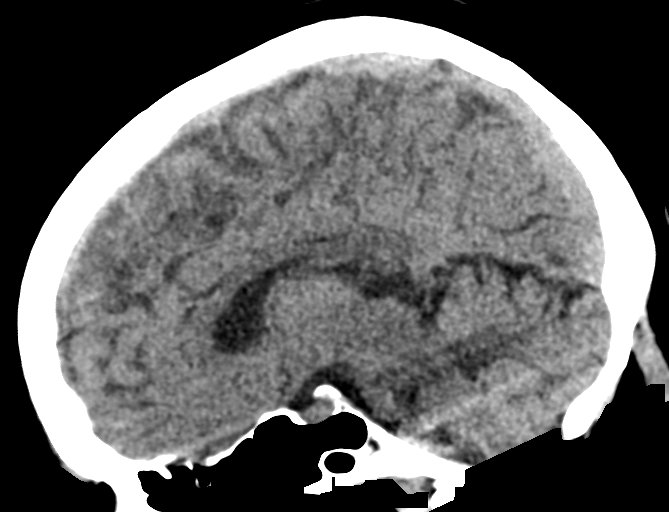
[im 35/52  brain]
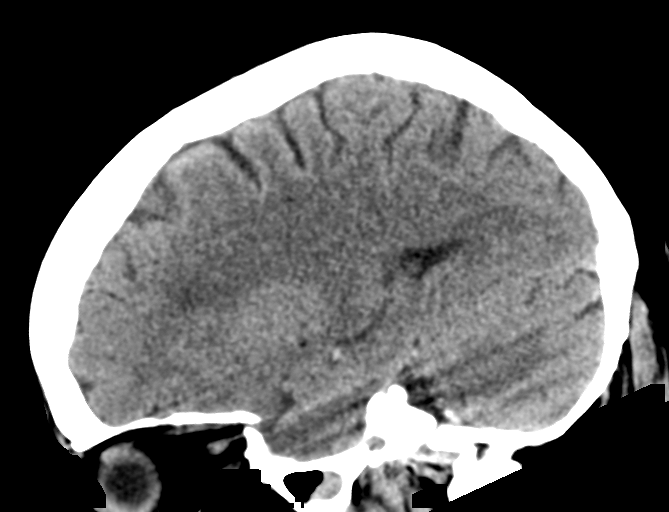

[15 of 46 positions shown; findings below may reference images not displayed]

FINDINGS: CT HEAD FINDINGS

Brain: Mild generalized atrophy. Mild small vessel changes of the
white matter, most notable in the right frontal white matter. No
large vessel territory insult. No mass lesion, hemorrhage,
hydrocephalus or extra-axial collection.

Vascular: No abnormal vascular finding.

Skull: No skull fracture.

Sinuses/Orbits: Small amount of fluid in the left division of the
sphenoid sinus. Other sinuses are clear. Old medial orbital blowout
fracture on the right. Orbits negative.

Other: None

CT CERVICAL SPINE FINDINGS

Alignment: Normal

Skull base and vertebrae: Previous ACDF C3 through C5 appears solid
without complicating feature.

Soft tissues and spinal canal: No traumatic soft tissue finding.

Disc levels: Foramen magnum widely patent. Ordinary mild
osteoarthritis of the C1-2 articulation without encroachment upon
the neural structures.

C2-3: Normal

C3 through C5: Good appearance following previous ACDF.

C5-6: Mild bilateral uncovertebral hypertrophy. Moderate bilateral
facet degeneration right more than left. No central canal stenosis.
Bilateral bony foraminal narrowing, right more than.

C6-7: Mild disc bulge.  No compressive stenosis.

C7-T1 and T1-T2: Normal

Upper chest: Negative

Other: None
IMPRESSION: Head CT: No acute or traumatic finding. Minor small vessel changes
of the white matter, most notable in the right frontal region.

Cervical spine CT: No acute or traumatic finding. Previous ACDF C3
through C5 has a good appearance. Adjacent segment degenerative
changes at C5-6 bilateral foraminal narrowing because of osteophytic
encroachment, right more than left.

## 2021-10-08 ENCOUNTER — Other Ambulatory Visit: Payer: Self-pay | Admitting: Internal Medicine

## 2021-10-08 DIAGNOSIS — Z1231 Encounter for screening mammogram for malignant neoplasm of breast: Secondary | ICD-10-CM

## 2021-11-21 ENCOUNTER — Ambulatory Visit
Admission: RE | Admit: 2021-11-21 | Discharge: 2021-11-21 | Disposition: A | Discharge status: Home / Self Care | Payer: Medicare Other | Source: Ambulatory Visit | Attending: Internal Medicine | Admitting: Internal Medicine

## 2021-11-21 DIAGNOSIS — Z1231 Encounter for screening mammogram for malignant neoplasm of breast: Secondary | ICD-10-CM | POA: Diagnosis present

## 2022-01-16 ENCOUNTER — Ambulatory Visit: Payer: Medicare Other | Admitting: Urology

## 2022-01-22 ENCOUNTER — Other Ambulatory Visit: Payer: Self-pay | Admitting: *Deleted

## 2022-01-22 DIAGNOSIS — N200 Calculus of kidney: Secondary | ICD-10-CM

## 2022-01-23 ENCOUNTER — Ambulatory Visit
Admission: RE | Admit: 2022-01-23 | Discharge: 2022-01-23 | Disposition: A | Discharge status: Home / Self Care | Payer: Medicare Other | Source: Ambulatory Visit | Attending: Urology | Admitting: Urology

## 2022-01-23 ENCOUNTER — Encounter: Payer: Self-pay | Admitting: Urology

## 2022-01-23 ENCOUNTER — Ambulatory Visit (INDEPENDENT_AMBULATORY_CARE_PROVIDER_SITE_OTHER): Payer: Medicare Other | Admitting: Urology

## 2022-01-23 VITALS — BP 120/74 | HR 66 | Ht 65.5 in | Wt 185.0 lb

## 2022-01-23 DIAGNOSIS — N3281 Overactive bladder: Secondary | ICD-10-CM | POA: Diagnosis not present

## 2022-01-23 DIAGNOSIS — R102 Pelvic and perineal pain: Secondary | ICD-10-CM

## 2022-01-23 DIAGNOSIS — N200 Calculus of kidney: Secondary | ICD-10-CM

## 2022-01-23 NOTE — Progress Notes (Signed)
   01/23/2022 2:00 PM   Danielle Richardson [birth year] [id number]  Reason for visit: Follow up nephrolithiasis, incontinence, pelvic pain  HPI: 76 year old female who underwent a microscopic hematuria work-up in September 2019 that showed an asymptomatic 1.4 cm right lower pole nonobstructing stone in normal cystoscopy.  She opted for observation.  I personally reviewed her KUB today that shows no significant change in size of her right-sided asymptomatic stone, and she would like to continue observation.  We discussed that this has not changed over the last 4 years, and she would like to stop yearly imaging which is not unreasonable.  Return precautions discussed at length including recurrent UTIs, gross hematuria or right-sided flank pain.  She previously was having problems with overactive bladder and urge incontinence, and was trialed on oxybutynin.  She ultimately discontinued this medication secondary to side effects of dry mouth, and her urinary symptoms of urgency and urge incontinence have essentially resolved.  She is very rare urge incontinence that is minimally bothersome, and urinary symptoms are well controlled at this time.  She reports she underwent a prior surgery for stress incontinence over 20 years ago with a Dr. Davis Gourd.    She also has a history of pelvic/vaginal pain that resolved with management of constipation as well as topical estrogen cream and this has not recurred.  Follow-up urology as needed  [place], [place] [address] [place], [place] [address] [phone]

## 2022-01-23 NOTE — Progress Notes (Signed)
120/74  

## 2022-01-23 NOTE — Patient Instructions (Signed)

## 2022-06-02 ENCOUNTER — Emergency Department: Payer: Medicare Other

## 2022-06-02 ENCOUNTER — Other Ambulatory Visit: Payer: Self-pay

## 2022-06-02 ENCOUNTER — Emergency Department
Admission: EM | Admit: 2022-06-02 | Discharge: 2022-06-03 | Disposition: A | Discharge status: Home / Self Care | Payer: Medicare Other | Attending: Emergency Medicine | Admitting: Emergency Medicine

## 2022-06-02 DIAGNOSIS — S90511A Abrasion, right ankle, initial encounter: Secondary | ICD-10-CM | POA: Insufficient documentation

## 2022-06-02 DIAGNOSIS — W109XXA Fall (on) (from) unspecified stairs and steps, initial encounter: Secondary | ICD-10-CM | POA: Insufficient documentation

## 2022-06-02 DIAGNOSIS — S0990XA Unspecified injury of head, initial encounter: Secondary | ICD-10-CM | POA: Insufficient documentation

## 2022-06-02 DIAGNOSIS — S22019A Unspecified fracture of first thoracic vertebra, initial encounter for closed fracture: Secondary | ICD-10-CM | POA: Insufficient documentation

## 2022-06-02 DIAGNOSIS — G319 Degenerative disease of nervous system, unspecified: Secondary | ICD-10-CM | POA: Insufficient documentation

## 2022-06-02 DIAGNOSIS — M542 Cervicalgia: Secondary | ICD-10-CM | POA: Diagnosis present

## 2022-06-02 DIAGNOSIS — I2510 Atherosclerotic heart disease of native coronary artery without angina pectoris: Secondary | ICD-10-CM | POA: Diagnosis not present

## 2022-06-02 MED ORDER — OXYCODONE-ACETAMINOPHEN 5-325 MG PO TABS: 1.0000 | ORAL_TABLET | ORAL | 0 refills | Status: AC | PRN

## 2022-06-02 MED ORDER — OXYCODONE-ACETAMINOPHEN 5-325 MG PO TABS
1.0000 | ORAL_TABLET | Freq: Once | ORAL | Status: AC
  Administered 2022-06-02: 1 via ORAL
  Filled 2022-06-02: qty 1

## 2022-06-02 MED ORDER — ONDANSETRON 4 MG PO TBDP
4.0000 mg | ORAL_TABLET | Freq: Once | ORAL | Status: AC
  Administered 2022-06-02: 4 mg via ORAL
  Filled 2022-06-02: qty 1

## 2022-06-02 MED ORDER — ONDANSETRON 4 MG PO TBDP: 4.0000 mg | ORAL_TABLET | Freq: Three times a day (TID) | ORAL | 0 refills | Status: DC | PRN

## 2022-06-02 NOTE — ED Notes (Signed)
Patient to CT at this time.

## 2022-06-02 NOTE — ED Triage Notes (Signed)
Patient states she tripped and fell down one step and hit the back of her head on concrete step. Denies LOC. Takes Eliquis. Hematoma noted to back of head. Patient AOX4. Reports head pain and right ankle pain. Speaking in full clear sentences. Resp even, unlabored on RA.

## 2022-06-02 NOTE — ED Provider Notes (Signed)
[facility] Provider Note    Event Date/Time   First MD Initiated Contact with Patient 06/02/22 2106     (approximate)  History   Chief Complaint: Fall  HPI  Danielle Richardson is a 77 y.o. female with a past medical history arthritis, CAD, gastric reflux, presents to the emergency department after mechanical fall.  According to the patient she tripped going down a single step falling backwards.  Patient's main complaint is pain to her right ankle and pain to the bottom of her neck.  States a prior neck surgery in 2010.  Denies any weakness or numbness.  No LOC.  No headache.  Physical Exam   Triage Vital Signs: ED Triage Vitals  Enc Vitals Group     BP 06/02/22 2018 (!) 146/68     Pulse Rate 06/02/22 2018 78     Resp 06/02/22 2018 20     Temp 06/02/22 2018 98.5 F (36.9 C)     Temp Source 06/02/22 2018 Oral     SpO2 06/02/22 2018 96 %     Weight 06/02/22 2019 185 lb (83.9 kg)     Height 06/02/22 2019 '5\' 6"'$  (1.676 m)     Head Circumference --      Peak Flow --      Pain Score 06/02/22 2019 8     Pain Loc --      Pain Edu? --      Excl. in [place]? --     Most recent vital signs: Vitals:   06/02/22 2018  BP: (!) 146/68  Pulse: 78  Resp: 20  Temp: 98.5 F (36.9 C)  SpO2: 96%    General: Awake, no distress.  CV:  Good peripheral perfusion.  Regular rate and rhythm  Resp:  Normal effort.  Equal breath sounds bilaterally.  Abd:  No distention.  Soft, nontender.  No rebound or guarding. Other:  Patient does have tenderness to the base of the C-spine, mild to palpation.  C-collar in place.   ED Results / Procedures / Treatments   RADIOLOGY  I reviewed and interpreted CT head images.  No large bleed seen on my evaluation. Radiology is read the CT scan of the head is negative however the CT scan of the C-spine shows an inferior endplate compression deformity. X-ray of the ankle is negative   MEDICATIONS ORDERED IN ED: Medications - No data  to display   IMPRESSION / MDM / [place] / ED COURSE  I reviewed the triage vital signs and the nursing notes.  Patient's presentation is most consistent with acute presentation with potential threat to life or bodily function.  Patient presents to the emergency department for mechanical fall.  [place] backwards is having pain at the base of her neck and right ankle.  Right ankle has a small abrasion over the ankle, x-rays negative.  Patient CT scan of the head is negative for bleed or other acute abnormality.  Her CT scan of the neck however does show a T1 endplate compression fracture below her prior cervical operation.  I spoke with neurosurgery and they are requesting the patient be placed in a CTO brace and they will follow-up in the office in 4 weeks.  They have obtained the patient's information will be calling her to set this appointment.  We will discuss with our brace department to see if we can arrange for a CTO to be placed in the emergency department.  Awaiting brace placement.  Patient care signed out to oncoming provider.  FINAL CLINICAL IMPRESSION(S) / ED DIAGNOSES   T1 fracture    Note:  This document was prepared using Dragon voice recognition software and may include unintentional dictation errors.   [place] Dark, MD 06/02/22 [phone]

## 2022-06-02 NOTE — Discharge Instructions (Addendum)
Please follow-up with neurosurgery.  They should be calling you to arrange his appointment if you do not hear from them by Monday afternoon please call the number provided to arrange a follow-up appointment.  Please take your pain medication as needed but only as prescribed.  Please wear your brace as directed.

## 2022-06-02 NOTE — ED Notes (Signed)
This RN informed of the patients CT reading by EDP. C-Collar applied at this time and patient to the next bed via wheelchair.

## 2022-06-02 NOTE — Progress Notes (Signed)
Orthopedic Tech Progress Note Patient Details:  Danielle Richardson [birth year] [id number]  Patient ID: [name], female   DOB: 30-Oct-1945, 77 y.o.   MRN: [id number] I called stat order into hanger. Karolee Stamps 06/02/2022, 9:41 PM

## 2022-06-03 ENCOUNTER — Telehealth: Payer: Self-pay

## 2022-06-03 NOTE — Telephone Encounter (Signed)
06/18/2022 with xrays

## 2022-06-03 NOTE — Progress Notes (Signed)
Neurosurgery brief note Had been contacted by the emergency room after this patient presented to the emergency room after a fall.  She has previously undergone a anterior cervical disc ectomy and fusion at 2 levels and on CT scan imaging in the emergency room she had identified a compression fracture of T1 no canal compromise good alignment.  The patient was neurologically intact per the report in the emergency room  CT scan imaging of the C spine revealed IMPRESSION: CT of the head: Mild atrophic changes without acute abnormality.   CT of the cervical spine: Inferior endplate compression deformity of T1 without posterior element involvement. Only mild vertebral body height loss is seen. Some mild sclerosis is seen at the fracture site.   Postsurgical changes from C3-C5.  AP: Requested the patient be placed in a CTO collar when out of bed and mobilize as tolerated.  Pain control per the emergency room and the patient was likely to be discharged from the emergency room with follow-up with neurosurgery to be arranged.  Luciano Cutter. Johnney Killian, M.D. Neurosurgery

## 2022-06-03 NOTE — Telephone Encounter (Signed)
The ER called Dr Johnney Killian about this patient on 06/02/22 regarding a T1 fracture.   Message from Dr Johnney Killian:  "[id number] PMH sig for prior C3-5 ACDF s/p fall with new T1 compression fracture 25% approx LOH no retropulsion good alignment neuro intact. PLAN: CTO brace when OOB, shower OK Needs outpatient followup set up, [place] from ED"

## 2022-06-03 NOTE — Telephone Encounter (Signed)
Discussed with Stacy. Please offer a new patient appointment in 2-3 weeks with Marzetta Board or Danielle. She will need xrays that day. Thanks

## 2022-06-14 ENCOUNTER — Other Ambulatory Visit: Payer: Self-pay

## 2022-06-14 DIAGNOSIS — S22010S Wedge compression fracture of first thoracic vertebra, sequela: Secondary | ICD-10-CM

## 2022-06-16 NOTE — Progress Notes (Unsigned)
Referring Physician:  [name], MD [facility] [place],  [place] [address]  Primary Physician:  [name], MD  History of Present Illness: 06/18/2022 [name] has a history of CAD, GERD, HTN, afib, COPD, osteoporosis, and hyperlipidemia.   History of ACDF C3-C5 about 10 years ago.   Seen in ED on 06/02/22 after a fall. CT showed endplate compression fracture of T1. CTO brace was placed in ED and she is here for follow up.   She has only removed her brace to shower. She has no significant pain in her neck/mid back. No arm/leg pain. No numbness, tingling, or weakness.   She was given percocet and ondansetron from the ED. She is not taking them.   She is taking ELIQUIS.    Conservative measures:  Physical therapy: non  Multimodal medical therapy including regular antiinflammatories: percocet  Injections: no epidural steroid injections  Past Surgery:  ACDF C3-C5 about 10 years ago  Review of Systems:  A 10 point review of systems is negative, except for the pertinent positives and negatives detailed in the HPI.  Past Medical History: Past Medical History:  Diagnosis Date   Arthritis    Cataract    Chronic obstructive pulmonary disease ([place]) 09/11/2013   Coronary artery disease involving native coronary artery of native heart 09/23/2016   Overview:  Minimal by cath 2017   GERD (gastroesophageal reflux disease)    HLD (hyperlipidemia) 09/11/2013   Joint pain    Left bundle branch block    A-fib, Left BBB   Migraine    Pelvic pain in female    SUI (stress urinary incontinence, female)    Vertigo    Vertigo     Past Surgical History: Past Surgical History:  Procedure Laterality Date   AUGMENTATION MAMMAPLASTY Bilateral    Pt had implants and then removed   bladder tack  1999   CARDIAC CATHETERIZATION N/A 09/12/2015   Procedure: Left Heart Cath and Coronary Angiography;  Surgeon: Corey Skains, MD;  Location:  [place] [facility];  Service: Cardiovascular;  Laterality: N/A;   CATARACT EXTRACTION W/ INTRAOCULAR LENS  IMPLANT, BILATERAL  2018   CHOLECYSTECTOMY  1994   COLONOSCOPY WITH PROPOFOL N/A 02/25/2017   Procedure: COLONOSCOPY WITH PROPOFOL;  Surgeon: Virgel Manifold, MD;  Location: [place];  Service: Endoscopy;  Laterality: N/A;   COLONOSCOPY WITH PROPOFOL N/A 06/17/2018   Procedure: COLONOSCOPY WITH PROPOFOL;  Surgeon: Virgel Manifold, MD;  Location: [facility];  Service: Endoscopy;  Laterality: N/A;   COLONOSCOPY WITH PROPOFOL N/A 07/23/2021   Procedure: COLONOSCOPY WITH PROPOFOL;  Surgeon: Lin Landsman, MD;  Location: [facility]OS[facility];  Service: Gastroenterology;  Laterality: N/A;   ESOPHAGOGASTRODUODENOSCOPY (EGD) WITH PROPOFOL N/A 02/25/2017   Procedure: ESOPHAGOGASTRODUODENOSCOPY (EGD) WITH PROPOFOL;  Surgeon: Virgel Manifold, MD;  Location: [place];  Service: Endoscopy;  Laterality: N/A;   EYE SURGERY     HYSTEROSCOPY WITH D & C N/A 01/08/2016   Procedure: DILATATION AND CURETTAGE /HYSTEROSCOPY;  Surgeon: Brayton Mars, MD;  Location: [facility];  Service: Gynecology;  Laterality: N/A;   JOINT REPLACEMENT  2010   right shoulder   NECK SURGERY  2010   Cervical Discectomy with fusion & plating   SHOULDER SURGERY Right 2010   Total Shoulder Replacement, [facility]   TOE SURGERY      Allergies: Allergies as of 06/18/2022 - Review Complete 06/18/2022  Allergen Reaction Noted  Penicillins Rash 04/25/2015   Ivp dye [iodinated contrast media] Swelling 01/21/2018   Pravastatin Other (See Comments) 10/08/2016   Alendronate Nausea Only 04/25/2015   Codeine Nausea Only 04/25/2015   Oseltamivir Rash 04/25/2015   Rofecoxib Swelling 04/25/2015    Medications: Outpatient Encounter Medications as of 06/18/2022  Medication Sig   albuterol (PROVENTIL HFA;VENTOLIN HFA) 108 (90 Base) MCG/ACT inhaler Inhale 2 puffs into the lungs every 6  (six) hours as needed for wheezing or shortness of breath.   Calcium-Vitamin D-Vitamin K 500-100-40 MG-UNT-MCG CHEW Chew 1 Dose by mouth daily.   clonazePAM (KLONOPIN) 0.5 MG tablet Take 0.5 mg by mouth daily as needed.   conjugated estrogens (PREMARIN) vaginal cream Place vaginally.   diltiazem (CARDIZEM CD) 180 MG 24 hr capsule Take 180 mg by mouth daily.   ELIQUIS 5 MG TABS tablet Take 5 mg by mouth 2 (two) times daily.   fluticasone (FLONASE) 50 MCG/ACT nasal spray Place 2 sprays into the nose at bedtime.    meclizine (ANTIVERT) 25 MG tablet Take 25 mg by mouth 3 (three) times daily as needed for dizziness.   mometasone (ELOCON) 0.1 % lotion Apply topically daily. 5 times per week to affected areas of scalp and behind ears   Multiple Vitamins-Minerals (MULTIVITAMIN GUMMIES ADULT PO) Take 1 Dose by mouth daily.   naloxone (NARCAN) nasal spray 4 mg/0.1 mL SMARTSIG:Both Nares   ondansetron (ZOFRAN-ODT) 4 MG disintegrating tablet Take 1 tablet (4 mg total) by mouth every 8 (eight) hours as needed for nausea or vomiting.   oxyCODONE-acetaminophen (PERCOCET) 5-325 MG tablet Take 1 tablet by mouth every 4 (four) hours as needed for severe pain.   pantoprazole (PROTONIX) 40 MG tablet Take 40 mg by mouth 2 (two) times daily.    polyethylene glycol powder (GLYCOLAX/MIRALAX) powder Take by mouth as needed.    rizatriptan (MAXALT) 10 MG tablet Take 10 mg by mouth as needed.    rosuvastatin (CRESTOR) 5 MG tablet Take 5 mg by mouth daily.   No facility-administered encounter medications on file as of 06/18/2022.    Social History: Social History   Tobacco Use   Smoking status: Former    Types: Cigarettes    Quit date: 04/02/1999    Years since quitting: 23.2   Smokeless tobacco: Never  Vaping Use   Vaping Use: Never used  Substance Use Topics   Alcohol use: Not Currently   Drug use: No    Family Medical History: Family History  Problem Relation Age of Onset   Diabetes Father    Diabetes  Brother    Colon cancer Maternal Uncle    Ovarian cancer Maternal Grandfather    Breast cancer Neg Hx     Physical Examination: Vitals:   06/18/22 1041  BP: 130/77  Pulse: 84  SpO2: 96%    General: Patient is well developed, well nourished, calm, collected, and in no apparent distress. Attention to examination is appropriate.  Respiratory: Patient is breathing without any difficulty.   NEUROLOGICAL:     Awake, alert, oriented to person, place, and time.  Speech is clear and fluent. Fund of knowledge is appropriate.   Cranial Nerves: Pupils equal round and reactive to light.  Facial tone is symmetric.    She is wearing CTO brace.   She has minimal tenderness at cervicothoracic junction.   Strength: Side Biceps Triceps Deltoid Interossei Grip Wrist Ext. Wrist Flex.  R 5 5 5 5 5 5 5   L 5 5 5 5  5  5 5   Side Iliopsoas Quads Hamstring PF DF EHL  R 5 5 5 5 5 5   L 5 5 5 5 5 5    Reflexes are 2+ and symmetric at the biceps, triceps, brachioradialis, patella and achilles.   Hoffman's is absent.  Clonus is not present.   Bilateral upper and lower extremity sensation is intact to light touch.     Gait is normal.    Medical Decision Making  Imaging: Cervical xrays dated 06/18/22:  ACDF C3-C5. Stable T1 compression fracture  Radiology report for above images not yet available. Images reviewed with Dr. Izora Ribas.   CT of cervical spine dated 06/02/22:  CT CERVICAL SPINE FINDINGS   Alignment: Within normal limits.   Skull base and vertebrae: 7 cervical segments are well visualized. Vertebral body height is well maintained. Interbody fusion is noted at C3-4 and C4-5 with anterior fixation. Osteophytic changes are seen at C5-6. There is evidence of an inferior endplate compression fraction involving T1. some mild increased sclerosis is noted in this may be acute to subacute in nature. Only mild vertebral body height loss is seen.   Soft tissues and spinal canal: Surrounding  soft tissue structures are within normal limits.   Upper chest: Visualized lung apices are unremarkable.   Other: None   IMPRESSION: CT of the head: Mild atrophic changes without acute abnormality.   CT of the cervical spine: Inferior endplate compression deformity of T1 without posterior element involvement. Only mild vertebral body height loss is seen. Some mild sclerosis is seen at the fracture site.   Postsurgical changes from C3-C5.   Critical Value/emergent results were called by telephone at the time of interpretation on 06/02/2022 at 8:52 pm to Dr. Harvest Dark , who verbally acknowledged these results.     Electronically Signed   By: Inez Catalina M.D.   On: 06/02/2022 20:54  I have personally reviewed the images and agree with the above interpretation.  Assessment and Plan: [name] is a pleasant 77 y.o. female has T1 compression fracture s/p fall on 06/02/22. Also with history of ACDF C3-C5 done about 10 years ago.   She has no significant neck/thoracic pain. No pain in arms or legs. No numbness, tingling, or weakness.   Treatment options discussed with patient and following plan made with Dr. Izora Ribas:   - Continue CTO brace. Can remove to sleep.  - No lifting over 5 pounds. No overhead lifting.  - Follow up in 4 weeks with repeat cervical xrays. If these are stable, she can change to hard collar for 6 additional weeks. Can send to [facility] to have them remove thoracic brace from collar.   I spent a total of 30 minutes in face-to-face and non-face-to-face activities related to this patient's care today including review of outside records, review of imaging, review of symptoms, physical exam, discussion of differential diagnosis, discussion of treatment options, and documentation.   Thank you for involving me in the care of this patient.   Geronimo Boot PA-C Dept. of Neurosurgery

## 2022-06-17 NOTE — Addendum Note (Signed)
Addended by: Herb Grays on: 06/17/2022 09:32 AM   Modules accepted: Orders

## 2022-06-18 ENCOUNTER — Ambulatory Visit (INDEPENDENT_AMBULATORY_CARE_PROVIDER_SITE_OTHER): Payer: Medicare Other | Admitting: Orthopedic Surgery

## 2022-06-18 ENCOUNTER — Ambulatory Visit
Admission: RE | Admit: 2022-06-18 | Discharge: 2022-06-18 | Disposition: A | Discharge status: Home / Self Care | Payer: Medicare Other | Attending: Orthopedic Surgery | Admitting: Orthopedic Surgery

## 2022-06-18 ENCOUNTER — Encounter: Payer: Self-pay | Admitting: Orthopedic Surgery

## 2022-06-18 ENCOUNTER — Ambulatory Visit
Admission: RE | Admit: 2022-06-18 | Discharge: 2022-06-18 | Disposition: A | Discharge status: Home / Self Care | Payer: Medicare Other | Source: Ambulatory Visit | Attending: Orthopedic Surgery | Admitting: Orthopedic Surgery

## 2022-06-18 VITALS — BP 130/77 | HR 84 | Ht 65.0 in | Wt 185.0 lb

## 2022-06-18 DIAGNOSIS — S22010A Wedge compression fracture of first thoracic vertebra, initial encounter for closed fracture: Secondary | ICD-10-CM

## 2022-06-18 DIAGNOSIS — Z981 Arthrodesis status: Secondary | ICD-10-CM

## 2022-06-18 DIAGNOSIS — S22010S Wedge compression fracture of first thoracic vertebra, sequela: Secondary | ICD-10-CM | POA: Insufficient documentation

## 2022-06-18 NOTE — Patient Instructions (Signed)
It was so nice to see you today. Thank you so much for coming in.    Your xrays showed your T1 fracture is stable.   You can remove your brace to sleep. Wear when up and around. No lifting over 5 pounds. No overhead lifting.   I ordered xrays of your neck to be done prior to your next visit. You can get these at [facility] (building with the white pillars) off of [address]. The address is [address] [place], [place] [address]. You can go an hour before your visit with me.   I will see you back in 4 weeks. Please do not hesitate to call if you have any questions or concerns. You can also message me in [place].   Geronimo Boot PA-C [phone]

## 2022-07-15 NOTE — Progress Notes (Signed)
Referring Physician:  Marguarite Arbour, MD [address] [facility] [place],  [place] [address]  Primary Physician:  Marguarite Arbour, MD  History of Present Illness: 07/16/2022 Danielle Richardson has a history of CAD, GERD, HTN, afib, COPD, osteoporosis, and hyperlipidemia.   History of ACDF C3-C5 about 10 years ago.   Last seen by me on 06/18/22 for T1 compression fracture s/p fall on 06/02/22. She had no neck or thoracic pain. No pain in arms or legs.   She was to continue with CTO brace. She is here for follow up and repeat xrays.   She has minimal neck pain. No arm or leg pain. She has been wearing her brace. No numbness, tingling, or weakness.  She is taking ELIQUIS.   Past Surgery:  ACDF C3-C5 about 10 years ago  Review of Systems:  A 10 point review of systems is negative, except for the pertinent positives and negatives detailed in the HPI.  Past Medical History: Past Medical History:  Diagnosis Date   Arthritis    Cataract    Chronic obstructive pulmonary disease 09/11/2013   Coronary artery disease involving native coronary artery of native heart 09/23/2016   Overview:  Minimal by cath 2017   GERD (gastroesophageal reflux disease)    HLD (hyperlipidemia) 09/11/2013   Joint pain    Left bundle branch block    A-fib, Left BBB   Migraine    Pelvic pain in female    SUI (stress urinary incontinence, female)    Vertigo    Vertigo     Past Surgical History: Past Surgical History:  Procedure Laterality Date   AUGMENTATION MAMMAPLASTY Bilateral    Pt had implants and then removed   bladder tack  1999   CARDIAC CATHETERIZATION N/A 09/12/2015   Procedure: Left Heart Cath and Coronary Angiography;  Surgeon: Lamar Blinks, MD;  Location: [facility];  Service: Cardiovascular;  Laterality: N/A;   CATARACT EXTRACTION W/ INTRAOCULAR LENS  IMPLANT, BILATERAL  2018   CHOLECYSTECTOMY  1994   COLONOSCOPY WITH PROPOFOL N/A 02/25/2017    Procedure: COLONOSCOPY WITH PROPOFOL;  Surgeon: Pasty Spillers, MD;  Location: [facility];  Service: Endoscopy;  Laterality: N/A;   COLONOSCOPY WITH PROPOFOL N/A 06/17/2018   Procedure: COLONOSCOPY WITH PROPOFOL;  Surgeon: Pasty Spillers, MD;  Location: [facility] ENDOS[facility];  Service: Endoscopy;  Laterality: N/A;   COLONOSCOPY WITH PROPOFOL N/A 07/23/2021   Procedure: COLONOSCOPY WITH PROPOFOL;  Surgeon: Toney Reil, MD;  Location: [facility];  Service: Gastroenterology;  Laterality: N/A;   ESOPHAGOGASTRODUODENOSCOPY (EGD) WITH PROPOFOL N/A 02/25/2017   Procedure: ESOPHAGOGASTRODUODENOSCOPY (EGD) WITH PROPOFOL;  Surgeon: Pasty Spillers, MD;  Location: [facility];  Service: Endoscopy;  Laterality: N/A;   EYE SURGERY     HYSTEROSCOPY WITH D & C N/A 01/08/2016   Procedure: DILATATION AND CURETTAGE /HYSTEROSCOPY;  Surgeon: Herold Harms, MD;  Location: [facility];  Service: Gynecology;  Laterality: N/A;   JOINT REPLACEMENT  2010   right shoulder   NECK SURGERY  2010   Cervical Discectomy with fusion & plating   SHOULDER SURGERY Right 2010   Total Shoulder Replacement, [facility]   TOE SURGERY      Allergies: Allergies as of 07/16/2022 - Review Complete 07/16/2022  Allergen Reaction Noted   Penicillins Rash 04/25/2015   Ivp dye [iodinated contrast media] Swelling 01/21/2018   Pravastatin Other (See Comments) 10/08/2016   Alendronate Nausea Only 04/25/2015  Codeine Nausea Only 04/25/2015   Oseltamivir Rash 04/25/2015   Rofecoxib Swelling 04/25/2015    Medications: Outpatient Encounter Medications as of 07/16/2022  Medication Sig   albuterol (PROVENTIL HFA;VENTOLIN HFA) 108 (90 Base) MCG/ACT inhaler Inhale 2 puffs into the lungs every 6 (six) hours as needed for wheezing or shortness of breath.   Calcium-Vitamin D-Vitamin K 500-100-40 MG-UNT-MCG CHEW Chew 1 Dose by mouth daily.   clonazePAM (KLONOPIN) 0.5 MG tablet Take 0.5 mg by mouth  daily as needed.   conjugated estrogens (PREMARIN) vaginal cream Place vaginally.   diltiazem (CARDIZEM CD) 180 MG 24 hr capsule Take 180 mg by mouth daily.   ELIQUIS 5 MG TABS tablet Take 5 mg by mouth 2 (two) times daily.   fluticasone (FLONASE) 50 MCG/ACT nasal spray Place 2 sprays into the nose at bedtime.    meclizine (ANTIVERT) 25 MG tablet Take 25 mg by mouth 3 (three) times daily as needed for dizziness.   mometasone (ELOCON) 0.1 % lotion Apply topically daily. 5 times per week to affected areas of scalp and behind ears   Multiple Vitamins-Minerals (MULTIVITAMIN GUMMIES ADULT PO) Take 1 Dose by mouth daily.   naloxone (NARCAN) nasal spray 4 mg/0.1 mL SMARTSIG:Both Nares   oxyCODONE-acetaminophen (PERCOCET) 5-325 MG tablet Take 1 tablet by mouth every 4 (four) hours as needed for severe pain.   pantoprazole (PROTONIX) 40 MG tablet Take 40 mg by mouth 2 (two) times daily.    polyethylene glycol powder (GLYCOLAX/MIRALAX) powder Take by mouth as needed.    rizatriptan (MAXALT) 10 MG tablet Take 10 mg by mouth as needed.    rosuvastatin (CRESTOR) 5 MG tablet Take 5 mg by mouth daily.   [DISCONTINUED] ondansetron (ZOFRAN-ODT) 4 MG disintegrating tablet Take 1 tablet (4 mg total) by mouth every 8 (eight) hours as needed for nausea or vomiting.   No facility-administered encounter medications on file as of 07/16/2022.    Social History: Social History   Tobacco Use   Smoking status: Former    Types: Cigarettes    Quit date: 04/02/1999    Years since quitting: 23.3   Smokeless tobacco: Never  Vaping Use   Vaping Use: Never used  Substance Use Topics   Alcohol use: Not Currently   Drug use: No    Family Medical History: Family History  Problem Relation Age of Onset   Diabetes Father    Diabetes Brother    Colon cancer Maternal Uncle    Ovarian cancer Maternal Grandfather    Breast cancer Neg Hx     Physical Examination: Vitals:   07/16/22 1023  BP: 130/64  Pulse: 87   SpO2: 98%       Awake, alert, oriented to person, place, and time.  Speech is clear and fluent. Fund of knowledge is appropriate.   Cranial Nerves: Pupils equal round and reactive to light.  Facial tone is symmetric.    She is wearing CTO brace.   She has no tenderness at cervicothoracic junction.   Strength: Side Biceps Triceps Deltoid Interossei Grip Wrist Ext. Wrist Flex.  R 5 5 5 5 5 5 5   L 5 5 5 5 5 5 5    Side Iliopsoas Quads Hamstring PF DF EHL  R 5 5 5 5 5 5   L 5 5 5 5 5 5    Reflexes are 2+ and symmetric at the biceps, triceps, brachioradialis, patella and achilles.   Hoffman's is absent.  Clonus is not present.   Bilateral upper and  lower extremity sensation is intact to light touch.     Gait is normal.    Medical Decision Making  Imaging: Cervical xrays dated 07/16/22:  ACDF C3-C5. Stable T1 compression fracture.   Radiology report for above images not yet available. Images reviewed with Dr. Myer Haff.   Assessment and Plan: Danielle Richardson is a pleasant 77 y.o. female that has a T1 compression fracture s/p fall on 06/02/22. Also with history of ACDF C3-C5 done about 10 years ago.   She has no significant neck/thoracic pain. No pain in arms or legs. No numbness, tingling, or weakness.   Treatment options discussed with patient and following plan made with Dr. Myer Haff:   - Can remove thoracic orthosis and continue with hard cervical collar. Will contact [facility] to do this. If cannot be done, then will get new cervical collar.  - Continue with brace. Can remove to sleep.  - No lifting over 5 pounds. No overhead lifting.  - Follow up in 6 weeks with repeat cervical xrays (flexion/extension).   I spent a total of 20 minutes in face-to-face and non-face-to-face activities related to this patient's care today including review of outside records, review of imaging, review of symptoms, physical exam, discussion of differential diagnosis, discussion of treatment options,  and documentation.   Drake Leach PA-C Dept. of Neurosurgery

## 2022-07-16 ENCOUNTER — Ambulatory Visit
Admission: RE | Admit: 2022-07-16 | Discharge: 2022-07-16 | Disposition: A | Discharge status: Home / Self Care | Payer: Medicare Other | Attending: Orthopedic Surgery | Admitting: Orthopedic Surgery

## 2022-07-16 ENCOUNTER — Ambulatory Visit
Admission: RE | Admit: 2022-07-16 | Discharge: 2022-07-16 | Disposition: A | Discharge status: Home / Self Care | Payer: Medicare Other | Source: Ambulatory Visit | Attending: Orthopedic Surgery | Admitting: Orthopedic Surgery

## 2022-07-16 ENCOUNTER — Ambulatory Visit (INDEPENDENT_AMBULATORY_CARE_PROVIDER_SITE_OTHER): Payer: Medicare Other | Admitting: Orthopedic Surgery

## 2022-07-16 ENCOUNTER — Encounter: Payer: Self-pay | Admitting: Orthopedic Surgery

## 2022-07-16 VITALS — BP 130/64 | HR 87 | Ht 65.0 in | Wt 187.8 lb

## 2022-07-16 DIAGNOSIS — W19XXXA Unspecified fall, initial encounter: Secondary | ICD-10-CM

## 2022-07-16 DIAGNOSIS — S22010D Wedge compression fracture of first thoracic vertebra, subsequent encounter for fracture with routine healing: Secondary | ICD-10-CM | POA: Diagnosis not present

## 2022-07-16 DIAGNOSIS — Z981 Arthrodesis status: Secondary | ICD-10-CM

## 2022-07-16 DIAGNOSIS — S22010A Wedge compression fracture of first thoracic vertebra, initial encounter for closed fracture: Secondary | ICD-10-CM | POA: Diagnosis present

## 2022-07-16 NOTE — Patient Instructions (Signed)
It was so nice to see you today. Thank you so much for coming in.    Your xrays showed your T1 fracture is stable.   You can remove your brace to sleep. Wear when up and around. No lifting over 5 pounds. No overhead lifting.   [facility] should be calling you to schedule a visit for them to remove the bottom part of your brace. If you don't hear from them then you can call them at [phone]. They are located at [address] in [place].   I ordered xrays of your neck to be done prior to your next visit. You can get these at [facility] (building with the white pillars) off of [address]. The address is [address] [place], [place] [address]. You can go an hour before your visit with me.   I will see you back in 6 weeks. Please do not hesitate to call if you have any questions or concerns. You can also message me in MyChart.   Drake Leach PA-C [phone]

## 2022-07-24 ENCOUNTER — Ambulatory Visit (INDEPENDENT_AMBULATORY_CARE_PROVIDER_SITE_OTHER): Payer: Medicare Other | Admitting: Dermatology

## 2022-07-24 VITALS — BP 139/74

## 2022-07-24 DIAGNOSIS — I8310 Varicose veins of unspecified lower extremity with inflammation: Secondary | ICD-10-CM

## 2022-07-24 DIAGNOSIS — L409 Psoriasis, unspecified: Secondary | ICD-10-CM | POA: Diagnosis not present

## 2022-07-24 DIAGNOSIS — L578 Other skin changes due to chronic exposure to nonionizing radiation: Secondary | ICD-10-CM

## 2022-07-24 DIAGNOSIS — L738 Other specified follicular disorders: Secondary | ICD-10-CM

## 2022-07-24 DIAGNOSIS — Z1283 Encounter for screening for malignant neoplasm of skin: Secondary | ICD-10-CM | POA: Diagnosis not present

## 2022-07-24 DIAGNOSIS — L814 Other melanin hyperpigmentation: Secondary | ICD-10-CM

## 2022-07-24 DIAGNOSIS — Z79899 Other long term (current) drug therapy: Secondary | ICD-10-CM

## 2022-07-24 DIAGNOSIS — L821 Other seborrheic keratosis: Secondary | ICD-10-CM

## 2022-07-24 DIAGNOSIS — D229 Melanocytic nevi, unspecified: Secondary | ICD-10-CM

## 2022-07-24 MED ORDER — MOMETASONE FUROATE 0.1 % EX SOLN: Freq: Every day | CUTANEOUS | 11 refills | Status: DC

## 2022-07-24 NOTE — Progress Notes (Signed)
Follow-Up Visit   Subjective  Marie S [place] is a 77 y.o. female who presents for the following: Skin Cancer Screening and Full Body Skin Exam, check spot R medial canthus >74yr, no symptoms, Psoriasis scalp, post auriculars, Mometasone lotion prn The patient presents for Total-Body Skin Exam (TBSE) for skin cancer screening and mole check. The patient has spots, moles and lesions to be evaluated, some may be new or changing and the patient has concerns that these could be cancer.  The following portions of the chart were reviewed this encounter and updated as appropriate: medications, allergies, medical history  Review of Systems:  No other skin or systemic complaints except as noted in HPI or Assessment and Plan.  Objective  Well appearing patient in no apparent distress; mood and affect are within normal limits.  A full examination was performed including scalp, head, eyes, ears, nose, lips, neck, chest, axillae, abdomen, back, buttocks, bilateral upper extremities, bilateral lower extremities, hands, feet, fingers, toes, fingernails, and toenails. All findings within normal limits unless otherwise noted below.   Relevant physical exam findings are noted in the Assessment and Plan.   Assessment & Plan   LENTIGINES, SEBORRHEIC KERATOSES, HEMANGIOMAS - Benign normal skin lesions - Benign-appearing - Call for any changes  MELANOCYTIC NEVI - Tan-brown and/or pink-flesh-colored symmetric macules and papules - Benign appearing on exam today - Observation - Call clinic for new or changing moles - Recommend daily use of broad spectrum spf 30+ sunscreen to sun-exposed areas.   ACTINIC DAMAGE - Chronic condition, secondary to cumulative UV/sun exposure - diffuse scaly erythematous macules with underlying dyspigmentation - Recommend daily broad spectrum sunscreen SPF 30+ to sun-exposed areas, reapply every 2 hours as needed.  - Staying in the shade or wearing long sleeves, sun glasses  (UVA+UVB protection) and wide brim hats (4-inch brim around the entire circumference of the hat) are also recommended for sun protection.  - Call for new or changing lesions.  SKIN CANCER SCREENING PERFORMED TODAY.  PSORIASIS Post scalp, post auricular Exam: scalp clear today Chronic condition with duration or expected duration over one year. Currently well-controlled. Psoriasis is a chronic non-curable, but treatable genetic/hereditary disease that may have other systemic features affecting other organ systems such as joints (Psoriatic Arthritis). It is associated with an increased risk of inflammatory bowel disease, heart disease, non-alcoholic fatty liver disease, and depression.  Treatments include light and laser treatments; topical medications; and systemic medications including oral and injectables.  Treatment Plan: Cont Mometasone lotion qd up to 5d/wk prn flares  Topical steroids (such as triamcinolone, fluocinolone, fluocinonide, mometasone, clobetasol, halobetasol, betamethasone, hydrocortisone) can cause thinning and lightening of the skin if they are used for too long in the same area. Your physician has selected the right strength medicine for your problem and area affected on the body. Please use your medication only as directed by your physician to prevent side effects.     SPIDER VEINS - Dilated blue, purple or red veins at the lower extremities - Reassured - Smaller vessels can be treated by sclerotherapy (a procedure to inject a medicine into the veins to make them disappear) if desired, but the treatment is not covered by insurance. Larger vessels may be covered if symptomatic and we would refer to vascular surgeon if treatment desired.   Sebaceous Hyperplasia - Yellow pap R lat nose - Benign-appearing - Observe. Call for changes.   Return in about 1 year (around 07/24/2023) for TBSE.  I, Sonya Hupman, RMA, am acting  as scribe for Armida Sans, MD .  Documentation:  I have reviewed the above documentation for accuracy and completeness, and I agree with the above.  Armida Sans, MD

## 2022-07-24 NOTE — Patient Instructions (Signed)
Due to recent changes in healthcare laws, you may see results of your pathology and/or laboratory studies on MyChart before the doctors have had a chance to review them. We understand that in some cases there may be results that are confusing or concerning to you. Please understand that not all results are received at the same time and often the doctors may need to interpret multiple results in order to provide you with the best plan of care or course of treatment. Therefore, we ask that you please give us 2 business days to thoroughly review all your results before contacting the office for clarification. Should we see a critical lab result, you will be contacted sooner.   If You Need Anything After Your Visit  If you have any questions or concerns for your doctor, please call our main line at [phone] and press option 4 to reach your doctor's medical assistant. If no one answers, please leave a voicemail as directed and we will return your call as soon as possible. Messages left after 4 pm will be answered the following business day.   You may also send us a message via MyChart. We typically respond to MyChart messages within 1-2 business days.  For prescription refills, please ask your pharmacy to contact our office. Our fax number is [phone].  If you have an urgent issue when the clinic is closed that cannot wait until the next business day, you can page your doctor at the number below.    Please note that while we do our best to be available for urgent issues outside of office hours, we are not available 24/7.   If you have an urgent issue and are unable to reach us, you may choose to seek medical care at your doctor's office, retail clinic, urgent care center, or emergency room.  If you have a medical emergency, please immediately call 911 or go to the emergency department.  Pager Numbers  - Dr. Kowalski: [phone]  - Dr. Moye: [phone]  - Dr. Stewart:  [phone]  In the event of inclement weather, please call our main line at [phone] for an update on the status of any delays or closures.  Dermatology Medication Tips: Please keep the boxes that topical medications come in in order to help keep track of the instructions about where and how to use these. Pharmacies typically print the medication instructions only on the boxes and not directly on the medication tubes.   If your medication is too expensive, please contact our office at [phone] option 4 or send us a message through MyChart.   We are unable to tell what your co-pay for medications will be in advance as this is different depending on your insurance coverage. However, we may be able to find a substitute medication at lower cost or fill out paperwork to get insurance to cover a needed medication.   If a prior authorization is required to get your medication covered by your insurance company, please allow us 1-2 business days to complete this process.  Drug prices often vary depending on where the prescription is filled and some pharmacies may offer cheaper prices.  The website www.goodrx.com contains coupons for medications through different pharmacies. The prices here do not account for what the cost may be with help from insurance (it may be cheaper with your insurance), but the website can give you the price if you did not use any insurance.  - You can print the associated coupon and take it with   your prescription to the pharmacy.  - You may also stop by our office during regular business hours and pick up a GoodRx coupon card.  - If you need your prescription sent electronically to a different pharmacy, notify our office through [facility] MyChart or by phone at [phone] option 4.     Si Usted Necesita Algo Despus de Su Visita  Tambin puede enviarnos un mensaje a travs de MyChart. Por lo general respondemos a los mensajes de MyChart en el transcurso de 1 a 2  das hbiles.  Para renovar recetas, por favor pida a su farmacia que se ponga en contacto con nuestra oficina. Nuestro nmero de fax es el [phone].  Si tiene un asunto urgente cuando la clnica est cerrada y que no puede esperar hasta el siguiente da hbil, puede llamar/localizar a su doctor(a) al nmero que aparece a continuacin.   Por favor, tenga en cuenta que aunque hacemos todo lo posible para estar disponibles para asuntos urgentes fuera del horario de oficina, no estamos disponibles las 24 horas del da, los 7 das de la semana.   Si tiene un problema urgente y no puede comunicarse con nosotros, puede optar por buscar atencin mdica  en el consultorio de su doctor(a), en una clnica privada, en un centro de atencin urgente o en una sala de emergencias.  Si tiene una emergencia mdica, por favor llame inmediatamente al 911 o vaya a la sala de emergencias.  Nmeros de bper  - Dr. Kowalski: [phone]  - Dra. Moye: [phone]  - Dra. Stewart: [phone]  En caso de inclemencias del tiempo, por favor llame a nuestra lnea principal al [phone] para una actualizacin sobre el estado de cualquier retraso o cierre.  Consejos para la medicacin en dermatologa: Por favor, guarde las cajas en las que vienen los medicamentos de uso tpico para ayudarle a seguir las instrucciones sobre dnde y cmo usarlos. Las farmacias generalmente imprimen las instrucciones del medicamento slo en las cajas y no directamente en los tubos del medicamento.   Si su medicamento es muy caro, por favor, pngase en contacto con nuestra oficina llamando al [phone] y presione la opcin 4 o envenos un mensaje a travs de MyChart.   No podemos decirle cul ser su copago por los medicamentos por adelantado ya que esto es diferente dependiendo de la cobertura de su seguro. Sin embargo, es posible que podamos encontrar un medicamento sustituto a menor costo o llenar un formulario para que el  seguro cubra el medicamento que se considera necesario.   Si se requiere una autorizacin previa para que su compaa de seguros cubra su medicamento, por favor permtanos de 1 a 2 das hbiles para completar este proceso.  Los precios de los medicamentos varan con frecuencia dependiendo del lugar de dnde se surte la receta y alguna farmacias pueden ofrecer precios ms baratos.  El sitio web www.goodrx.com tiene cupones para medicamentos de diferentes farmacias. Los precios aqu no tienen en cuenta lo que podra costar con la ayuda del seguro (puede ser ms barato con su seguro), pero el sitio web puede darle el precio si no utiliz ningn seguro.  - Puede imprimir el cupn correspondiente y llevarlo con su receta a la farmacia.  - Tambin puede pasar por nuestra oficina durante el horario de atencin regular y recoger una tarjeta de cupones de GoodRx.  - Si necesita que su receta se enve electrnicamente a una farmacia diferente, informe a nuestra oficina a travs de MyChart de [place]   o por telfono llamando al [phone] y presione la opcin 4.  

## 2022-07-30 ENCOUNTER — Encounter: Payer: Self-pay | Admitting: Dermatology

## 2022-08-23 NOTE — Progress Notes (Unsigned)
Referring Physician:  Marguarite Arbour, MD [address] [facility] [place],  [place] [address]  Primary Physician:  Marguarite Arbour, MD  History of Present Illness: 08/23/2022 [name] has a history of CAD, GERD, HTN, afib, COPD, osteoporosis, and hyperlipidemia.   History of ACDF C3-C5 about 10 years ago.   Last seen by me on 07/16/22 for T1 compression fracture s/p fall on 06/02/22. She was changed from CTO brace to hard cervical collar at her last visit.   She is here for follow up and repeat xrays.   She had no neck or thoracic pain. No pain in arms or legs. She has been wearing her cervical collar.   She is taking ELIQUIS.   Past Surgery:  ACDF C3-C5 about 10 years ago  Review of Systems:  A 10 point review of systems is negative, except for the pertinent positives and negatives detailed in the HPI.  Past Medical History: Past Medical History:  Diagnosis Date   Arthritis    Cataract    Chronic obstructive pulmonary disease (HCC) 09/11/2013   Coronary artery disease involving native coronary artery of native heart 09/23/2016   Overview:  Minimal by cath 2017   GERD (gastroesophageal reflux disease)    HLD (hyperlipidemia) 09/11/2013   Joint pain    Left bundle branch block    A-fib, Left BBB   Migraine    Pelvic pain in female    SUI (stress urinary incontinence, female)    Vertigo    Vertigo     Past Surgical History: Past Surgical History:  Procedure Laterality Date   AUGMENTATION MAMMAPLASTY Bilateral    Pt had implants and then removed   bladder tack  1999   CARDIAC CATHETERIZATION N/A 09/12/2015   Procedure: Left Heart Cath and Coronary Angiography;  Surgeon: Lamar Blinks, MD;  Location: [facility] INVASIVE CV LAB;  Service: Cardiovascular;  Laterality: N/A;   CATARACT EXTRACTION W/ INTRAOCULAR LENS  IMPLANT, BILATERAL  2018   CHOLECYSTECTOMY  1994   COLONOSCOPY WITH PROPOFOL N/A 02/25/2017   Procedure: COLONOSCOPY WITH  PROPOFOL;  Surgeon: Pasty Spillers, MD;  Location: [facility];  Service: Endoscopy;  Laterality: N/A;   COLONOSCOPY WITH PROPOFOL N/A 06/17/2018   Procedure: COLONOSCOPY WITH PROPOFOL;  Surgeon: Pasty Spillers, MD;  Location: [facility] ENDOSCOPY;  Service: Endoscopy;  Laterality: N/A;   COLONOSCOPY WITH PROPOFOL N/A 07/23/2021   Procedure: COLONOSCOPY WITH PROPOFOL;  Surgeon: Toney Reil, MD;  Location: [facility] ENDOSCOPY;  Service: Gastroenterology;  Laterality: N/A;   ESOPHAGOGASTRODUODENOSCOPY (EGD) WITH PROPOFOL N/A 02/25/2017   Procedure: ESOPHAGOGASTRODUODENOSCOPY (EGD) WITH PROPOFOL;  Surgeon: Pasty Spillers, MD;  Location: [facility];  Service: Endoscopy;  Laterality: N/A;   EYE SURGERY     HYSTEROSCOPY WITH D & C N/A 01/08/2016   Procedure: DILATATION AND CURETTAGE /HYSTEROSCOPY;  Surgeon: Herold Harms, MD;  Location: [facility] ORS;  Service: Gynecology;  Laterality: N/A;   JOINT REPLACEMENT  2010   right shoulder   NECK SURGERY  2010   Cervical Discectomy with fusion & plating   SHOULDER SURGERY Right 2010   Total Shoulder Replacement, [facility]   TOE SURGERY      Allergies: Allergies as of 08/27/2022 - Review Complete 07/30/2022  Allergen Reaction Noted   Penicillins Rash 04/25/2015   Ivp dye [iodinated contrast media] Swelling 01/21/2018   Pravastatin Other (See Comments) 10/08/2016   Alendronate Nausea Only 04/25/2015   Codeine Nausea Only 04/25/2015  Oseltamivir Rash 04/25/2015   Rofecoxib Swelling 04/25/2015    Medications: Outpatient Encounter Medications as of 08/27/2022  Medication Sig   albuterol (PROVENTIL HFA;VENTOLIN HFA) 108 (90 Base) MCG/ACT inhaler Inhale 2 puffs into the lungs every 6 (six) hours as needed for wheezing or shortness of breath.   Calcium-Vitamin D-Vitamin K 500-100-40 MG-UNT-MCG CHEW Chew 1 Dose by mouth daily.   clonazePAM (KLONOPIN) 0.5 MG tablet Take 0.5 mg by mouth daily as needed.   conjugated  estrogens (PREMARIN) vaginal cream Place vaginally.   diltiazem (CARDIZEM CD) 180 MG 24 hr capsule Take 180 mg by mouth daily.   ELIQUIS 5 MG TABS tablet Take 5 mg by mouth 2 (two) times daily.   fluticasone (FLONASE) 50 MCG/ACT nasal spray Place 2 sprays into the nose at bedtime.    meclizine (ANTIVERT) 25 MG tablet Take 25 mg by mouth 3 (three) times daily as needed for dizziness.   mometasone (ELOCON) 0.1 % lotion Apply topically daily. 5 times per week to affected areas of scalp and behind ears   mometasone (ELOCON) 0.1 % lotion Apply topically daily. Qd up to 5d/wk aa scalp, post auriculars prn psoriasis flares   Multiple Vitamins-Minerals (MULTIVITAMIN GUMMIES ADULT PO) Take 1 Dose by mouth daily.   naloxone (NARCAN) nasal spray 4 mg/0.1 mL SMARTSIG:Both Nares   oxyCODONE-acetaminophen (PERCOCET) 5-325 MG tablet Take 1 tablet by mouth every 4 (four) hours as needed for severe pain.   pantoprazole (PROTONIX) 40 MG tablet Take 40 mg by mouth 2 (two) times daily.    polyethylene glycol powder (GLYCOLAX/MIRALAX) powder Take by mouth as needed.    rizatriptan (MAXALT) 10 MG tablet Take 10 mg by mouth as needed.    rosuvastatin (CRESTOR) 5 MG tablet Take 5 mg by mouth daily.   No facility-administered encounter medications on file as of 08/27/2022.    Social History: Social History   Tobacco Use   Smoking status: Former    Types: Cigarettes    Quit date: 04/02/1999    Years since quitting: 23.4   Smokeless tobacco: Never  Vaping Use   Vaping Use: Never used  Substance Use Topics   Alcohol use: Not Currently   Drug use: No    Family Medical History: Family History  Problem Relation Age of Onset   Diabetes Father    Diabetes Brother    Colon cancer Maternal Uncle    Ovarian cancer Maternal Grandfather    Breast cancer Neg Hx     Physical Examination: There were no vitals filed for this visit.  Awake, alert, oriented to person, place, and time.  Speech is clear and fluent.  Fund of knowledge is appropriate.   Cranial Nerves: Pupils equal round and reactive to light.  Facial tone is symmetric.    She has no tenderness at cervicothoracic junction.   Strength: Side Biceps Triceps Deltoid Interossei Grip Wrist Ext. Wrist Flex.  R 5 5 5 5 5 5 5   L 5 5 5 5 5 5 5    Side Iliopsoas Quads Hamstring PF DF EHL  R 5 5 5 5 5 5   L 5 5 5 5 5 5    Bilateral upper and lower extremity sensation is intact to light touch.     Gait is normal.    Medical Decision Making  Imaging: Cervical xrays dated 5/28/124:  ACDF C3-C5. Stable T1 compression fracture. No instability on flex/ext noted.   Radiology report for above images not yet available. Xrays reviewed with Dr. Myer Haff.  Assessment and Plan: Ms. [place] Richardson is a pleasant 77 y.o. female that has a T1 compression fracture s/p fall on 06/02/22. Also with history of ACDF C3-C5 done about 10 years ago.   She has no significant neck/thoracic pain. No pain in arms or legs. No numbness, tingling, or weakness.   Treatment options discussed with patient and following plan made:   - Can wean out of hard cervical collar.  - Slowly return to activities as tolerated. Care with lifting. She doesn't do a lot of lifting since she has limited ROM of right shoulder s/p surgery.  - Follow up prn.   I spent a total of 10 minutes in face-to-face and non-face-to-face activities related to this patient's care today including review of outside records, review of imaging, review of symptoms, physical exam, discussion of differential diagnosis, discussion of treatment options, and documentation.   Drake Leach PA-C Dept. of Neurosurgery

## 2022-08-27 ENCOUNTER — Encounter: Payer: Self-pay | Admitting: Orthopedic Surgery

## 2022-08-27 ENCOUNTER — Ambulatory Visit
Admission: RE | Admit: 2022-08-27 | Discharge: 2022-08-27 | Disposition: A | Discharge status: Home / Self Care | Payer: Medicare Other | Source: Ambulatory Visit | Attending: Orthopedic Surgery | Admitting: Orthopedic Surgery

## 2022-08-27 ENCOUNTER — Ambulatory Visit (INDEPENDENT_AMBULATORY_CARE_PROVIDER_SITE_OTHER): Payer: Medicare Other | Admitting: Orthopedic Surgery

## 2022-08-27 VITALS — BP 115/68 | Ht 65.0 in | Wt 187.0 lb

## 2022-08-27 DIAGNOSIS — W19XXXD Unspecified fall, subsequent encounter: Secondary | ICD-10-CM

## 2022-08-27 DIAGNOSIS — S22010D Wedge compression fracture of first thoracic vertebra, subsequent encounter for fracture with routine healing: Secondary | ICD-10-CM | POA: Diagnosis present

## 2022-08-27 DIAGNOSIS — Z981 Arthrodesis status: Secondary | ICD-10-CM | POA: Diagnosis present

## 2022-10-08 ENCOUNTER — Other Ambulatory Visit: Payer: Self-pay | Admitting: Internal Medicine

## 2022-10-08 DIAGNOSIS — Z1231 Encounter for screening mammogram for malignant neoplasm of breast: Secondary | ICD-10-CM

## 2022-12-03 ENCOUNTER — Ambulatory Visit
Admission: RE | Admit: 2022-12-03 | Discharge: 2022-12-03 | Disposition: A | Discharge status: Home / Self Care | Payer: Medicare Other | Source: Ambulatory Visit | Attending: Internal Medicine | Admitting: Internal Medicine

## 2022-12-03 DIAGNOSIS — Z1231 Encounter for screening mammogram for malignant neoplasm of breast: Secondary | ICD-10-CM | POA: Insufficient documentation

## 2023-07-30 ENCOUNTER — Ambulatory Visit: Payer: Medicare Other | Admitting: Dermatology

## 2023-07-30 ENCOUNTER — Encounter: Payer: Self-pay | Admitting: Dermatology

## 2023-07-30 DIAGNOSIS — Z1283 Encounter for screening for malignant neoplasm of skin: Secondary | ICD-10-CM

## 2023-07-30 DIAGNOSIS — L578 Other skin changes due to chronic exposure to nonionizing radiation: Secondary | ICD-10-CM

## 2023-07-30 DIAGNOSIS — L409 Psoriasis, unspecified: Secondary | ICD-10-CM | POA: Diagnosis not present

## 2023-07-30 DIAGNOSIS — L821 Other seborrheic keratosis: Secondary | ICD-10-CM

## 2023-07-30 DIAGNOSIS — D225 Melanocytic nevi of trunk: Secondary | ICD-10-CM

## 2023-07-30 DIAGNOSIS — L820 Inflamed seborrheic keratosis: Secondary | ICD-10-CM | POA: Diagnosis not present

## 2023-07-30 DIAGNOSIS — D1801 Hemangioma of skin and subcutaneous tissue: Secondary | ICD-10-CM

## 2023-07-30 DIAGNOSIS — D492 Neoplasm of unspecified behavior of bone, soft tissue, and skin: Secondary | ICD-10-CM

## 2023-07-30 DIAGNOSIS — D485 Neoplasm of uncertain behavior of skin: Secondary | ICD-10-CM

## 2023-07-30 DIAGNOSIS — Z79899 Other long term (current) drug therapy: Secondary | ICD-10-CM

## 2023-07-30 DIAGNOSIS — L719 Rosacea, unspecified: Secondary | ICD-10-CM

## 2023-07-30 DIAGNOSIS — Z7189 Other specified counseling: Secondary | ICD-10-CM

## 2023-07-30 DIAGNOSIS — D239 Other benign neoplasm of skin, unspecified: Secondary | ICD-10-CM

## 2023-07-30 DIAGNOSIS — W908XXA Exposure to other nonionizing radiation, initial encounter: Secondary | ICD-10-CM

## 2023-07-30 DIAGNOSIS — L814 Other melanin hyperpigmentation: Secondary | ICD-10-CM

## 2023-07-30 DIAGNOSIS — I781 Nevus, non-neoplastic: Secondary | ICD-10-CM

## 2023-07-30 DIAGNOSIS — D229 Melanocytic nevi, unspecified: Secondary | ICD-10-CM

## 2023-07-30 HISTORY — DX: Other benign neoplasm of skin, unspecified: D23.9

## 2023-07-30 MED ORDER — MOMETASONE FUROATE 0.1 % EX SOLN: Freq: Every day | CUTANEOUS | 11 refills | Status: AC

## 2023-07-30 NOTE — Progress Notes (Signed)
 Follow-Up Visit   Subjective  Danielle Richardson is a 78 y.o. female who presents for the following: Skin Cancer Screening and Full Body Skin Exam  The patient presents for Total-Body Skin Exam (TBSE) for skin cancer screening and mole check. The patient has spots, moles and lesions to be evaluated, some may be new or changing and the patient may have concern these could be cancer.  The following portions of the chart were reviewed this encounter and updated as appropriate: medications, allergies, medical history  Review of Systems:  No other skin or systemic complaints except as noted in HPI or Assessment and Plan.  Objective  Well appearing patient in no apparent distress; mood and affect are within normal limits.  A full examination was performed including scalp, head, eyes, ears, nose, lips, neck, chest, axillae, abdomen, back, buttocks, bilateral upper extremities, bilateral lower extremities, hands, feet, fingers, toes, fingernails, and toenails. All findings within normal limits unless otherwise noted below.   Relevant physical exam findings are noted in the Assessment and Plan.  R axilla x 1,L chest x 1, L upper eyelid x 1 (2) Erythematous stuck-on, waxy papule or plaque R sup buttock 0.3 cm irregular brown macule.  R post axillary fold 0.3 cm irregular brown macule.   Assessment & Plan   SKIN CANCER SCREENING PERFORMED TODAY.  ACTINIC DAMAGE - Chronic condition, secondary to cumulative UV/sun exposure - diffuse scaly erythematous macules with underlying dyspigmentation - Recommend daily broad spectrum sunscreen SPF 30+ to sun-exposed areas, reapply every 2 hours as needed.  - Staying in the shade or wearing long sleeves, sun glasses (UVA+UVB protection) and wide brim hats (4-inch brim around the entire circumference of the hat) are also recommended for sun protection.  - Call for new or changing lesions.  LENTIGINES, SEBORRHEIC KERATOSES, HEMANGIOMAS - Benign normal  skin lesions - Benign-appearing - Call for any changes  MELANOCYTIC NEVI - Tan-brown and/or pink-flesh-colored symmetric macules and papules - Benign appearing on exam today - Observation - Call clinic for new or changing moles - Recommend daily use of broad spectrum spf 30+ sunscreen to sun-exposed areas.   PSORIASIS BSA - 2% Post scalp, post auricular Exam: scalp clear today Chronic condition with duration or expected duration over one year. Currently well-controlled. Psoriasis is a chronic non-curable, but treatable genetic/hereditary disease that may have other systemic features affecting other organ systems such as joints (Psoriatic Arthritis). It is associated with an increased risk of inflammatory bowel disease, heart disease, non-alcoholic fatty liver disease, and depression.  Treatments include light and laser treatments; topical medications; and systemic medications including oral and injectables. Treatment Plan: Cont Mometasone  lotion qd up to 5d/wk prn flares   Topical steroids (such as triamcinolone, fluocinolone, fluocinonide, mometasone , clobetasol, halobetasol, betamethasone, hydrocortisone) can cause thinning and lightening of the skin if they are used for too long in the same area. Your physician has selected the right strength medicine for your problem and area affected on the body. Please use your medication only as directed by your physician to prevent side effects.  SPIDER VEINS - Dilated blue, purple or red veins at the lower extremities - Reassured - Smaller vessels can be treated by sclerotherapy (a procedure to inject a medicine into the veins to make them disappear) if desired, but the treatment is not covered by insurance. Larger vessels may be covered if symptomatic and we would refer to vascular surgeon if treatment desired.    Sebaceous Hyperplasia - Yellow pap R lat nose -  Benign-appearing - Observe. Call for changes.   ROSACEA Exam Mid face erythema  with telangiectasias +/- scattered inflammatory papules of the face. Erythema of eyes.  Chronic and persistent condition with duration or expected duration over one year. Condition is symptomatic/ bothersome to patient. Not currently at goal. Pt states that erythema of the eyes is related to recent shingles, and allergies. Rosacea is a chronic progressive skin condition usually affecting the face of adults, causing redness and/or acne bumps. It is treatable but not curable. It sometimes affects the eyes (ocular rosacea) as well. It may respond to topical and/or systemic medication and can flare with stress, sun exposure, alcohol, exercise, topical steroids (including hydrocortisone/cortisone 10) and some foods.  Daily application of broad spectrum spf 30+ sunscreen to face is recommended to reduce flares. Treatment Plan Mild, no tx needed.  INFLAMED SEBORRHEIC KERATOSIS (2) R axilla x 1,L chest x 1, L upper eyelid x 1 (2) Destruction of lesion - R axilla x 1,L chest x 1, L upper eyelid x 1 (2) Complexity: simple   Destruction method: cryotherapy   Informed consent: discussed and consent obtained   Timeout:  patient name, date of birth, surgical site, and procedure verified Lesion destroyed using liquid nitrogen: Yes   Region frozen until ice ball extended beyond lesion: Yes   Outcome: patient tolerated procedure well with no complications   Post-procedure details: wound care instructions given   NEOPLASM OF UNCERTAIN BEHAVIOR OF SKIN (2) R sup buttock Epidermal / dermal shaving  Lesion diameter (cm):  0.3 Informed consent: discussed and consent obtained   Timeout: patient name, date of birth, surgical site, and procedure verified   Procedure prep:  Patient was prepped and draped in usual sterile fashion Prep type:  Isopropyl alcohol Anesthesia: the lesion was anesthetized in a standard fashion   Anesthetic:  1% lidocaine  w/ epinephrine 1-100,000 buffered w/ 8.4% NaHCO3 Instrument used:  flexible razor blade   Hemostasis achieved with: pressure, aluminum chloride and electrodesiccation   Outcome: patient tolerated procedure well   Post-procedure details: sterile dressing applied and wound care instructions given   Dressing type: bandage (Mupirocin 2% ointment)   Specimen 1 - Surgical pathology Differential Diagnosis: D48.5 r/o dysplastic nevus Check Margins: Yes R post axillary fold Epidermal / dermal shaving  Lesion diameter (cm):  0.3 Informed consent: discussed and consent obtained   Timeout: patient name, date of birth, surgical site, and procedure verified   Procedure prep:  Patient was prepped and draped in usual sterile fashion Prep type:  Isopropyl alcohol Anesthesia: the lesion was anesthetized in a standard fashion   Anesthetic:  1% lidocaine  w/ epinephrine 1-100,000 buffered w/ 8.4% NaHCO3 Instrument used: flexible razor blade   Hemostasis achieved with: pressure, aluminum chloride and electrodesiccation   Outcome: patient tolerated procedure well   Post-procedure details: sterile dressing applied and wound care instructions given   Dressing type: bandage (Mupirocin 2% ointment)   Specimen 2 - Surgical pathology Differential Diagnosis: D48.5 r/o dysplastic nevus Check Margins: Yes  Return in about 1 year (around 07/29/2024) for TBSE, hx of psoraisis.  Arlinda Lais, CMA, am acting as scribe for Celine Collard, MD .   Documentation: I have reviewed the above documentation for accuracy and completeness, and I agree with the above.  Celine Collard, MD

## 2023-07-30 NOTE — Patient Instructions (Addendum)

## 2023-08-05 LAB — SURGICAL PATHOLOGY

## 2023-08-06 ENCOUNTER — Encounter: Payer: Self-pay | Admitting: Dermatology

## 2023-08-06 ENCOUNTER — Telehealth: Payer: Self-pay

## 2023-08-06 NOTE — Telephone Encounter (Addendum)
 Tried calling patient regarding results. No answer. LM for patient to return call. ----- Message from Celine Collard sent at 08/06/2023 11:45 AM EDT ----- FINAL DIAGNOSIS        1. Skin, R sup buttock :       DYSPLASTIC COMPOUND NEVUS WITH MODERATE ATYPIA, CLOSE TO MARGIN        2. Skin, R post axillary fold :       DYSPLASTIC JUNCTIONAL NEVUS WITH MODERATE ATYPIA, INFLAMED, CLOSE TO MARGIN   1&2 - both Moderate Dysplastic Recheck next visit

## 2023-08-06 NOTE — Telephone Encounter (Signed)
 Patient advised of BX results. aw

## 2023-08-06 NOTE — Telephone Encounter (Addendum)
 Tried calling patient. No answer. LM for patient to return call.  ----- Message from Celine Collard sent at 08/06/2023 11:45 AM EDT ----- FINAL DIAGNOSIS        1. Skin, R sup buttock :       DYSPLASTIC COMPOUND NEVUS WITH MODERATE ATYPIA, CLOSE TO MARGIN        2. Skin, R post axillary fold :       DYSPLASTIC JUNCTIONAL NEVUS WITH MODERATE ATYPIA, INFLAMED, CLOSE TO MARGIN   1&2 - both Moderate Dysplastic Recheck next visit

## 2023-10-13 ENCOUNTER — Other Ambulatory Visit: Payer: Self-pay | Admitting: Internal Medicine

## 2023-10-13 DIAGNOSIS — Z1231 Encounter for screening mammogram for malignant neoplasm of breast: Secondary | ICD-10-CM

## 2023-12-08 ENCOUNTER — Ambulatory Visit
Admission: RE | Admit: 2023-12-08 | Discharge: 2023-12-08 | Disposition: A | Discharge status: Home / Self Care | Source: Ambulatory Visit | Attending: Internal Medicine | Admitting: Internal Medicine

## 2023-12-08 DIAGNOSIS — Z1231 Encounter for screening mammogram for malignant neoplasm of breast: Secondary | ICD-10-CM | POA: Diagnosis present

## 2024-08-03 ENCOUNTER — Ambulatory Visit: Admitting: Dermatology
